# Patient Record
Sex: Male | Born: 1960 | Race: Black or African American | Marital: Single | Smoking: Current every day smoker
Health system: Southern US, Community
[De-identification: ages and names within clinical notes are randomized; demographics above are authoritative.]

## PROBLEM LIST (undated history)

## (undated) DIAGNOSIS — M1990 Unspecified osteoarthritis, unspecified site: Secondary | ICD-10-CM

## (undated) DIAGNOSIS — K219 Gastro-esophageal reflux disease without esophagitis: Secondary | ICD-10-CM

## (undated) DIAGNOSIS — T7840XA Allergy, unspecified, initial encounter: Secondary | ICD-10-CM

## (undated) DIAGNOSIS — M5442 Lumbago with sciatica, left side: Principal | ICD-10-CM

## (undated) DIAGNOSIS — G4730 Sleep apnea, unspecified: Secondary | ICD-10-CM

## (undated) DIAGNOSIS — F1910 Other psychoactive substance abuse, uncomplicated: Secondary | ICD-10-CM

## (undated) DIAGNOSIS — G8929 Other chronic pain: Principal | ICD-10-CM

## (undated) DIAGNOSIS — M5441 Lumbago with sciatica, right side: Principal | ICD-10-CM

## (undated) DIAGNOSIS — F329 Major depressive disorder, single episode, unspecified: Secondary | ICD-10-CM

## (undated) DIAGNOSIS — F419 Anxiety disorder, unspecified: Secondary | ICD-10-CM

## (undated) DIAGNOSIS — F32A Depression, unspecified: Secondary | ICD-10-CM

## (undated) HISTORY — DX: Lumbago with sciatica, left side: M54.42

## (undated) HISTORY — DX: Unspecified osteoarthritis, unspecified site: M19.90

## (undated) HISTORY — DX: Lumbago with sciatica, right side: M54.41

## (undated) HISTORY — DX: Other chronic pain: G89.29

## (undated) HISTORY — DX: Other psychoactive substance abuse, uncomplicated: F19.10

## (undated) HISTORY — DX: Major depressive disorder, single episode, unspecified: F32.9

## (undated) HISTORY — DX: Sleep apnea, unspecified: G47.30

## (undated) HISTORY — DX: Anxiety disorder, unspecified: F41.9

## (undated) HISTORY — DX: Gastro-esophageal reflux disease without esophagitis: K21.9

## (undated) HISTORY — DX: Allergy, unspecified, initial encounter: T78.40XA

## (undated) HISTORY — DX: Depression, unspecified: F32.A

---

## 2012-02-17 DIAGNOSIS — M659 Synovitis and tenosynovitis, unspecified: Secondary | ICD-10-CM | POA: Insufficient documentation

## 2012-09-26 HISTORY — PX: CARPAL TUNNEL RELEASE: SHX101

## 2015-01-20 DIAGNOSIS — M67432 Ganglion, left wrist: Secondary | ICD-10-CM | POA: Insufficient documentation

## 2015-01-20 DIAGNOSIS — M24541 Contracture, right hand: Secondary | ICD-10-CM | POA: Insufficient documentation

## 2015-04-22 DIAGNOSIS — Z9989 Dependence on other enabling machines and devices: Secondary | ICD-10-CM

## 2015-04-22 DIAGNOSIS — F319 Bipolar disorder, unspecified: Secondary | ICD-10-CM | POA: Insufficient documentation

## 2015-04-22 DIAGNOSIS — G4733 Obstructive sleep apnea (adult) (pediatric): Secondary | ICD-10-CM | POA: Insufficient documentation

## 2015-11-23 DIAGNOSIS — M25532 Pain in left wrist: Secondary | ICD-10-CM

## 2015-11-23 DIAGNOSIS — G8929 Other chronic pain: Secondary | ICD-10-CM | POA: Insufficient documentation

## 2016-11-24 DIAGNOSIS — M545 Low back pain: Secondary | ICD-10-CM

## 2016-11-24 DIAGNOSIS — G8929 Other chronic pain: Secondary | ICD-10-CM | POA: Insufficient documentation

## 2017-01-05 HISTORY — PX: BACK SURGERY: SHX140

## 2017-02-09 DIAGNOSIS — Z981 Arthrodesis status: Secondary | ICD-10-CM | POA: Insufficient documentation

## 2017-06-29 DIAGNOSIS — M5416 Radiculopathy, lumbar region: Secondary | ICD-10-CM | POA: Insufficient documentation

## 2018-09-12 ENCOUNTER — Ambulatory Visit: Payer: Non-veteran care | Admitting: Nurse Practitioner

## 2018-10-02 ENCOUNTER — Ambulatory Visit: Payer: Non-veteran care | Admitting: Nurse Practitioner

## 2018-10-03 ENCOUNTER — Ambulatory Visit
Admission: RE | Admit: 2018-10-03 | Discharge: 2018-10-03 | Disposition: A | Discharge status: Home / Self Care | Payer: No Typology Code available for payment source | Source: Ambulatory Visit | Attending: Nurse Practitioner | Admitting: Nurse Practitioner

## 2018-10-03 ENCOUNTER — Ambulatory Visit (HOSPITAL_BASED_OUTPATIENT_CLINIC_OR_DEPARTMENT_OTHER): Payer: No Typology Code available for payment source | Admitting: Nurse Practitioner

## 2018-10-03 ENCOUNTER — Encounter: Payer: Self-pay | Admitting: Nurse Practitioner

## 2018-10-03 VITALS — BP 106/59 | HR 73 | Temp 98.6°F | Resp 16 | Ht 68.0 in | Wt 180.0 lb

## 2018-10-03 DIAGNOSIS — Z79891 Long term (current) use of opiate analgesic: Secondary | ICD-10-CM

## 2018-10-03 DIAGNOSIS — M25511 Pain in right shoulder: Secondary | ICD-10-CM

## 2018-10-03 DIAGNOSIS — G8929 Other chronic pain: Secondary | ICD-10-CM | POA: Insufficient documentation

## 2018-10-03 DIAGNOSIS — G894 Chronic pain syndrome: Secondary | ICD-10-CM | POA: Insufficient documentation

## 2018-10-03 DIAGNOSIS — M533 Sacrococcygeal disorders, not elsewhere classified: Secondary | ICD-10-CM | POA: Insufficient documentation

## 2018-10-03 DIAGNOSIS — M5442 Lumbago with sciatica, left side: Secondary | ICD-10-CM | POA: Diagnosis not present

## 2018-10-03 DIAGNOSIS — Z789 Other specified health status: Secondary | ICD-10-CM | POA: Insufficient documentation

## 2018-10-03 DIAGNOSIS — M79605 Pain in left leg: Secondary | ICD-10-CM

## 2018-10-03 DIAGNOSIS — Z79899 Other long term (current) drug therapy: Secondary | ICD-10-CM

## 2018-10-03 DIAGNOSIS — M5441 Lumbago with sciatica, right side: Secondary | ICD-10-CM | POA: Insufficient documentation

## 2018-10-03 DIAGNOSIS — M79604 Pain in right leg: Secondary | ICD-10-CM

## 2018-10-03 DIAGNOSIS — M899 Disorder of bone, unspecified: Secondary | ICD-10-CM | POA: Insufficient documentation

## 2018-10-03 HISTORY — DX: Other chronic pain: G89.29

## 2018-10-03 NOTE — Progress Notes (Signed)
Safety precautions to be maintained throughout the outpatient stay will include: orient to surroundings, keep bed in low position, maintain call bell within reach at all times, provide assistance with transfer out of bed and ambulation.  

## 2018-10-03 NOTE — Patient Instructions (Addendum)
____________________________________________________________________________________________  Appointment Policy Summary  It is our goal and responsibility to provide the medical community with assistance in the evaluation and management of patients with chronic pain. Unfortunately our resources are limited. Because we do not have an unlimited amount of time, or available appointments, we are required to closely monitor and manage their use. The following rules exist to maximize their use:  Patient's responsibilities: 1. Punctuality:  At what time should I arrive? You should be physically present in our office 30 minutes before your scheduled appointment. Your scheduled appointment is with your assigned healthcare provider. However, it takes 5-10 minutes to be "checked-in", and another 15 minutes for the nurses to do the admission. If you arrive to our office at the time you were given for your appointment, you will end up being at least 20-25 minutes late to your appointment with the provider. 2. Tardiness:  What happens if I arrive only a few minutes after my scheduled appointment time? You will need to reschedule your appointment. The cutoff is your appointment time. This is why it is so important that you arrive at least 30 minutes before that appointment. If you have an appointment scheduled for 10:00 AM and you arrive at 10:01, you will be required to reschedule your appointment.  3. Plan ahead:  Always assume that you will encounter traffic on your way in. Plan for it. If you are dependent on a driver, make sure they understand these rules and the need to arrive early. 4. Other appointments and responsibilities:  Avoid scheduling any other appointments before or after your pain clinic appointments.  5. Be prepared:  Write down everything that you need to discuss with your healthcare provider and give this information to the admitting nurse. Write down the medications that you will need  refilled. Bring your pills and bottles (even the empty ones), to all of your appointments, except for those where a procedure is scheduled. 6. No children or pets:  Find someone to take care of them. It is not appropriate to bring them in. 7. Scheduling changes:  We request "advanced notification" of any changes or cancellations. 8. Advanced notification:  Defined as a time period of more than 24 hours prior to the originally scheduled appointment. This allows for the appointment to be offered to other patients. 9. Rescheduling:  When a visit is rescheduled, it will require the cancellation of the original appointment. For this reason they both fall within the category of "Cancellations".  10. Cancellations:  They require advanced notification. Any cancellation less than 24 hours before the  appointment will be recorded as a "No Show". 11. No Show:  Defined as an unkept appointment where the patient failed to notify or declare to the practice their intention or inability to keep the appointment.  Corrective process for repeat offenders:  1. Tardiness: Three (3) episodes of rescheduling due to late arrivals will be recorded as one (1) "No Show". 2. Cancellation or reschedule: Three (3) cancellations or rescheduling will be recorded as one (1) "No Show". 3. "No Shows": Three (3) "No Shows" within a 12 month period will result in discharge from the practice. ____________________________________________________________________________________________   ______________________________________________________________________________________________  Specialty Pain Scale  Introduction:  There are significant differences in how pain is reported. The word pain usually refers to physical pain, but it is also a common synonym of suffering. The medical community uses a scale from 0 (zero) to 10 (ten) to report pain level. Zero (0) is described as "no pain",   while ten (10) is described as "the worse pain  you can imagine". The problem with this scale is that physical pain is reported along with suffering. Suffering refers to mental pain, or more often yet it refers to any unpleasant feeling, emotion or aversion associated with the perception of harm or threat of harm. It is the psychological component of pain.  Pain Specialists prefer to separate the two components. The pain scale used by this practice is the Verbal Numerical Rating Scale (VNRS-11). This scale is for the physical pain only. DO NOT INCLUDE how your pain psychologically affects you. This scale is for adults 21 years of age and older. It has 11 (eleven) levels. The 1st level is 0/10. This means: "right now, I have no pain". In the context of pain management, it also means: "right now, my physical pain is under control with the current therapy".  General Information:  The scale should reflect your current level of pain. Unless you are specifically asked for the level of your worst pain, or your average pain. If you are asked for one of these two, then it should be understood that it is over the past 24 hours.  Levels 1 (one) through 5 (five) are described below, and can be treated as an outpatient. Ambulatory pain management facilities such as ours are more than adequate to treat these levels. Levels 6 (six) through 10 (ten) are also described below, however, these must be treated as a hospitalized patient. While levels 6 (six) and 7 (seven) may be evaluated at an urgent care facility, levels 8 (eight) through 10 (ten) constitute medical emergencies and as such, they belong in a hospital's emergency department. When having these levels (as described below), do not come to our office. Our facility is not equipped to manage these levels. Go directly to an urgent care facility or an emergency department to be evaluated.  Definitions:  Activities of Daily Living (ADL): Activities of daily living (ADL or ADLs) is a term used in healthcare to refer to  people's daily self-care activities. Health professionals often use a person's ability or inability to perform ADLs as a measurement of their functional status, particularly in regard to people post injury, with disabilities and the elderly. There are two ADL levels: Basic and Instrumental. Basic Activities of Daily Living (BADL  or BADLs) consist of self-care tasks that include: Bathing and showering; personal hygiene and grooming (including brushing/combing/styling hair); dressing; Toilet hygiene (getting to the toilet, cleaning oneself, and getting back up); eating and self-feeding (not including cooking or chewing and swallowing); functional mobility, often referred to as "transferring", as measured by the ability to walk, get in and out of bed, and get into and out of a chair; the broader definition (moving from one place to another while performing activities) is useful for people with different physical abilities who are still able to get around independently. Basic ADLs include the things many people do when they get up in the morning and get ready to go out of the house: get out of bed, go to the toilet, bathe, dress, groom, and eat. On the average, loss of function typically follows a particular order. Hygiene is the first to go, followed by loss of toilet use and locomotion. The last to go is the ability to eat. When there is only one remaining area in which the person is independent, there is a 62.9% chance that it is eating and only a 3.5% chance that it is hygiene. Instrumental Activities   of Daily Living (IADL or IADLs) are not necessary for fundamental functioning, but they let an individual live independently in a community. IADL consist of tasks that include: cleaning and maintaining the house; home establishment and maintenance; care of others (including selecting and supervising caregivers); care of pets; child rearing; managing money; managing financials (investments, etc.); meal preparation  and cleanup; shopping for groceries and necessities; moving within the community; safety procedures and emergency responses; health management and maintenance (taking prescribed medications); and using the telephone or other form of communication.  Instructions:  Most patients tend to report their pain as a combination of two factors, their physical pain and their psychosocial pain. This last one is also known as "suffering" and it is reflection of how physical pain affects you socially and psychologically. From now on, report them separately.  From this point on, when asked to report your pain level, report only your physical pain. Use the following table for reference.  Pain Clinic Pain Levels (0-5/10)  Pain Level Score  Description  No Pain 0   Mild pain 1 Nagging, annoying, but does not interfere with basic activities of daily living (ADL). Patients are able to eat, bathe, get dressed, toileting (being able to get on and off the toilet and perform personal hygiene functions), transfer (move in and out of bed or a chair without assistance), and maintain continence (able to control bladder and bowel functions). Blood pressure and heart rate are unaffected. A normal heart rate for a healthy adult ranges from 60 to 100 bpm (beats per minute).   Mild to moderate pain 2 Noticeable and distracting. Impossible to hide from other people. More frequent flare-ups. Still possible to adapt and function close to normal. It can be very annoying and may have occasional stronger flare-ups. With discipline, patients may get used to it and adapt.   Moderate pain 3 Interferes significantly with activities of daily living (ADL). It becomes difficult to feed, bathe, get dressed, get on and off the toilet or to perform personal hygiene functions. Difficult to get in and out of bed or a chair without assistance. Very distracting. With effort, it can be ignored when deeply involved in activities.   Moderately severe pain  4 Impossible to ignore for more than a few minutes. With effort, patients may still be able to manage work or participate in some social activities. Very difficult to concentrate. Signs of autonomic nervous system discharge are evident: dilated pupils (mydriasis); mild sweating (diaphoresis); sleep interference. Heart rate becomes elevated (>115 bpm). Diastolic blood pressure (lower number) rises above 100 mmHg. Patients find relief in laying down and not moving.   Severe pain 5 Intense and extremely unpleasant. Associated with frowning face and frequent crying. Pain overwhelms the senses.  Ability to do any activity or maintain social relationships becomes significantly limited. Conversation becomes difficult. Pacing back and forth is common, as getting into a comfortable position is nearly impossible. Pain wakes you up from deep sleep. Physical signs will be obvious: pupillary dilation; increased sweating; goosebumps; brisk reflexes; cold, clammy hands and feet; nausea, vomiting or dry heaves; loss of appetite; significant sleep disturbance with inability to fall asleep or to remain asleep. When persistent, significant weight loss is observed due to the complete loss of appetite and sleep deprivation.  Blood pressure and heart rate becomes significantly elevated. Caution: If elevated blood pressure triggers a pounding headache associated with blurred vision, then the patient should immediately seek attention at an urgent or emergency care unit, as   these may be signs of an impending stroke.    Emergency Department Pain Levels (6-10/10)  Emergency Room Pain 6 Severely limiting. Requires emergency care and should not be seen or managed at an outpatient pain management facility. Communication becomes difficult and requires great effort. Assistance to reach the emergency department may be required. Facial flushing and profuse sweating along with potentially dangerous increases in heart rate and blood pressure  will be evident.   Distressing pain 7 Self-care is very difficult. Assistance is required to transport, or use restroom. Assistance to reach the emergency department will be required. Tasks requiring coordination, such as bathing and getting dressed become very difficult.   Disabling pain 8 Self-care is no longer possible. At this level, pain is disabling. The individual is unable to do even the most "basic" activities such as walking, eating, bathing, dressing, transferring to a bed, or toileting. Fine motor skills are lost. It is difficult to think clearly.   Incapacitating pain 9 Pain becomes incapacitating. Thought processing is no longer possible. Difficult to remember your own name. Control of movement and coordination are lost.   The worst pain imaginable 10 At this level, most patients pass out from pain. When this level is reached, collapse of the autonomic nervous system occurs, leading to a sudden drop in blood pressure and heart rate. This in turn results in a temporary and dramatic drop in blood flow to the brain, leading to a loss of consciousness. Fainting is one of the body's self defense mechanisms. Passing out puts the brain in a calmed state and causes it to shut down for a while, in order to begin the healing process.    Summary: 1. Refer to this scale when providing [place] with your pain level. 2. Be accurate and careful when reporting your pain level. This will help with your care. 3. Over-reporting your pain level will lead to loss of credibility. 4. Even a level of 1/10 means that there is pain and will be treated at our facility. 5. High, inaccurate reporting will be documented as "Symptom Exaggeration", leading to loss of credibility and suspicions of possible secondary gains such as obtaining more narcotics, or wanting to appear disabled, for fraudulent reasons. 6. Only pain levels of 5 or below will be seen at our facility. 7. Pain levels of 6 and above will be sent to the  Emergency Department and the appointment cancelled. ______________________________________________________________________________________________  BMI Assessment: Estimated body mass index is 27.37 kg/m as calculated from the following:   Height as of this encounter: 5\' 8"  (1.727 m).   Weight as of this encounter: 180 lb (81.6 kg).  BMI interpretation table: BMI level Category Range association with higher incidence of chronic pain  <18 kg/m2 Underweight   18.5-24.9 kg/m2 Ideal body weight   25-29.9 kg/m2 Overweight Increased incidence by 20%  30-34.9 kg/m2 Obese (Class I) Increased incidence by 68%  35-39.9 kg/m2 Severe obesity (Class II) Increased incidence by 136%  >40 kg/m2 Extreme obesity (Class III) Increased incidence by 254%   BMI Readings from Last 4 Encounters:  10/03/18 27.37 kg/m   Wt Readings from Last 4 Encounters:  10/03/18 180 lb (81.6 kg)

## 2018-10-03 NOTE — Progress Notes (Signed)
Patient's Name: Nicolas Colon  MRN: [id number]  Referring Provider: [facility]  DOB: 1960/11/29  PCP: [facility]  DOS: 10/03/2018  Note by: Dionisio David NP  Service setting: Ambulatory outpatient  Specialty: Interventional Pain Management  Location: [facility] (AMB) Pain Management Facility    Patient type: New Patient    Primary Reason(s) for Visit: Initial Patient Evaluation CC: Back Pain (lumbar bilateral, right is worse ); Shoulder Pain (bilateral right is worse ); and Wrist Pain (CTS, release on the left, braces on both )  HPI  Mr. Kretschmer is a 58 y.o. year old, male patient, who comes today for an initial evaluation. He has Pharmacologic therapy; Disorder of skeletal system; Problems influencing health status; Long term current use of opiate analgesic; Chronic pain syndrome; Chronic bilateral low back pain with bilateral sciatica (Primary Area of Pain) (R>L); Chronic pain of both lower extremities (Secondary Area of Pain) (R>L); and Chronic right shoulder pain (Tertiary Area of Pain) on their problem list.. His primarily concern today is the Back Pain (lumbar bilateral, right is worse ); Shoulder Pain (bilateral right is worse ); and Wrist Pain (CTS, release on the left, braces on both )  Pain Assessment: Location: Lower, Right, Left Back Radiating: back pain goes down both legs to the toes.  shoulder pain going into right arm.  Onset: More than a month ago Duration: Chronic pain Quality: Pins and needles, Nagging, Burning, Constant, Aching, Pressure, Sharp, Shooting, Stabbing, Throbbing, Tingling, Other (Comment)(aggonizing and pulsating. ) Severity: 5 /10 (subjective, self-reported pain score)  Note: Reported level is compatible with observation.                          Effect on ADL: patient has trouble bending, standing, walking, kneeling, working.  Timing: Constant Modifying factors: medications, stretching, rest  BP: (!) 106/59  HR: 73  Onset and Duration: Sudden, Date of  onset: 58 and Present longer than 3 months Cause of pain: Work related accident or event Severity: Getting worse, NAS-11 at its worse: 8/10, NAS-11 at its best: 3/10, NAS-11 now: 5/10 and NAS-11 on the average: 4/10 Timing: Not influenced by the time of the day, During activity or exercise, After activity or exercise and After a period of immobility Aggravating Factors: Bending, Climbing, Intercourse (sex), Kneeling, Lifiting, Prolonged sitting, Prolonged standing, Squatting, Stooping , Twisting, Walking, Walking uphill and Working Alleviating Factors: Stretching, Lying down, Medications, Resting and Sitting Associated Problems: Day-time cramps, Night-time cramps, Depression, Numbness, Spasms, Temperature changes, Tingling, Weakness, Pain that wakes patient up and Pain that does not allow patient to sleep Quality of Pain: Aching, Agonizing, Annoying, Burning, Constant, Nagging, Numb, Pressure-like, Pulsating, Sharp, Shooting, Stabbing, Throbbing, Tingling and Uncomfortable Previous Examinations or Tests: MRI scan, X-rays, Nerve conduction test and Neurosurgical evaluation Previous Treatments: Chiropractic manipulations, Epidural steroid injections, Narcotic medications, Physical Therapy, Stretching exercises and TENS  The patient comes into the clinics today for the first time for a chronic pain management evaluation.  According to the patient his primary area of pain is in his lower back.  He admits that he has history of a slipped disc.  He did undergo surgery is January 05, 2017 which included a lumbar fusion.  He admits the surgery was effective for his left leg pain.  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  He has had physical therapy along with chiropractic treatment.  He  admits that they both were effective for approximately 2 to 3 months.  His second area of pain is in his legs.  He admits that the right is currently  worse than the left.  He does have occasional left-sided leg pain and when that occurs it is worse than the right.  He admits the pain goes down the sides of his leg rotates to the front then to the bottom of both feet.  He has numbness tingling with occasional weakness.  He has had a nerve conduction study through the [facility].  His third area of pain is in his right shoulder.  He feels like it may be a pulled muscle however it has continued.  He denies any previous surgery, interventional therapy, physical therapy or recent images of his shoulder.  Today I took the time to provide the patient with information regarding this pain practice. The patient was informed that the practice is divided into two sections: an interventional pain management section, as well as a completely separate and distinct medication management section. I explained that there are procedure days for interventional therapies, and evaluation days for follow-ups and medication management. Because of the amount of documentation required during both, they are kept separated. This means that there is the possibility that he may be scheduled for a procedure on one day, and medication management the next. I have also informed him that because of staffing and facility limitations, this practice will no longer take patients for medication management only. To illustrate the reasons for this, I gave the patient the example of surgeons, and how inappropriate it would be to refer a patient to his/her care, just to write for the post-surgical antibiotics on a surgery done by a different surgeon.   Because interventional pain management is part of the board-certified specialty for the doctors, the patient was informed that joining this practice means that they are open to any and all interventional therapies. I made it clear that this does not mean that they will be forced to have any procedures done. What this means is that I believe  interventional therapies to be essential part of the diagnosis and proper management of chronic pain conditions. Therefore, patients not interested in these interventional alternatives will be better served under the care of a different practitioner.  The patient was also made aware of my Comprehensive Pain Management Safety Guidelines where by joining this practice, they limit all of their nerve blocks and joint injections to those done by our practice, for as long as we are retained to manage their care. Historic Controlled Substance Pharmacotherapy Review  PMP and historical list of controlled substances: Oxycodone 10 mg, hydrocodone/acetaminophen 5/325 mg, diazepam 5 mg, oxycodone/acetaminophen 5/325 mg, tramadol 50 mg, Highest opioid analgesic regimen found: Hydrocodone/acetaminophen 5/325 mg 1 to 2 tablets 2 to 3 hours for 6 days (fill date 01/06/2017) hydrocodone 62.5 mg/day Most recent opioid analgesic: None Current opioid analgesics: None Highest recorded MME/day: 62.5 mg/day MME/day: 0 mg/day Medications: The patient did not bring the medication(s) to the appointment, as requested in our "New Patient Package" Pharmacodynamics: Desired effects: Analgesia: The patient reports >50% benefit. Reported improvement in function: The patient reports medication allows him to accomplish basic ADLs. Clinically meaningful improvement in function (CMIF): Sustained CMIF goals met Perceived effectiveness: Described as relatively effective, allowing for increase in activities of daily living (ADL) Undesirable effects: Side-effects or Adverse reactions: None reported Historical Monitoring: The patient  reports current drug use. Drug: Marijuana. List of all  UDS Test(s): No results found for: MDMA, COCAINSCRNUR, PCPSCRNUR, PCPQUANT, CANNABQUANT, THCU, ETH List of all Serum Drug Screening Test(s):  No results found for: AMPHSCRSER, BARBSCRSER, BENZOSCRSER, COCAINSCRSER, PCPSCRSER, PCPQUANT, THCSCRSER,  CANNABQUANT, OPIATESCRSER, OXYSCRSER, PROPOXSCRSER Historical Background Evaluation: [place] PDMP: Six (6) year initial data search conducted.             [place] Department of public safety, offender search: Editor, commissioning Information) Non-contributory Risk Assessment Profile: Aberrant behavior: None observed or detected today Risk factors for fatal opioid overdose: None identified today Fatal overdose hazard ratio (HR): Calculation deferred Non-fatal overdose hazard ratio (HR): Calculation deferred Risk of opioid abuse or dependence: 0.7-3.0% with doses ? 36 MME/day and 6.1-26% with doses ? 120 MME/day. Substance use disorder (SUD) risk level: Pending results of Medical Psychology Evaluation for SUD Opioid risk tool (ORT) (Total Score): 13  ORT Scoring interpretation table:  Score <3 = Low Risk for SUD  Score between 4-7 = Moderate Risk for SUD  Score >8 = High Risk for Opioid Abuse   PHQ-2 Depression Scale:  Total score:    PHQ-2 Scoring interpretation table: (Score and probability of major depressive disorder)  Score 0 = No depression  Score 1 = 15.4% Probability  Score 2 = 21.1% Probability  Score 3 = 38.4% Probability  Score 4 = 45.5% Probability  Score 5 = 56.4% Probability  Score 6 = 78.6% Probability   PHQ-9 Depression Scale:  Total score:    PHQ-9 Scoring interpretation table:  Score 0-4 = No depression  Score 5-9 = Mild depression  Score 10-14 = Moderate depression  Score 15-19 = Moderately severe depression  Score 20-27 = Severe depression (2.4 times higher risk of SUD and 2.89 times higher risk of overuse)   Pharmacologic Plan: Pending ordered tests and/or consults  Meds  The patient has a current medication list which includes the following prescription(s): aspirin, augmented betamethasone dipropionate, baclofen, budesonide-formoterol, bupropion, carboxymethylcellulose, divalproex, duloxetine, flunisolide, gabapentin, hydroxyzine, ipratropium, ipratropium-albuterol, ketotifen,  lisinopril, meloxicam, montelukast, multiple vitamins-minerals, omeprazole, prazosin, quetiapine, ranitidine, sildenafil, sodium chloride, terazosin, trazodone, and vitamin d (cholecalciferol).  Current Outpatient Medications on File Prior to Visit  Medication Sig  . aspirin 81 MG chewable tablet Chew 81 mg by mouth daily.  Marland Kitchen augmented betamethasone dipropionate (DIPROLENE-AF) 0.05 % ointment Apply topically 2 (two) times daily.  . baclofen (LIORESAL) 10 MG tablet Take 10 mg by mouth 3 (three) times daily.  . budesonide-formoterol (SYMBICORT) 160-4.5 MCG/ACT inhaler Inhale 2 puffs into the lungs 2 (two) times daily.  Marland Kitchen buPROPion (ZYBAN) 150 MG 12 hr tablet Take 150 mg by mouth daily.  . carboxymethylcellulose (REFRESH PLUS) 0.5 % SOLN Place 1 drop into both eyes 3 (three) times daily as needed.  . divalproex (DEPAKOTE) 500 MG DR tablet Take 500 mg by mouth at bedtime.  . DULoxetine (CYMBALTA) 60 MG capsule Take 60 mg by mouth daily.  Marland Kitchen FLUNISOLIDE, NASAL, NA Place 1 spray into the nose 2 (two) times daily.  Marland Kitchen gabapentin (NEURONTIN) 300 MG capsule Take 300 mg by mouth 3 (three) times daily.  . hydrOXYzine (VISTARIL) 25 MG capsule Take 25 mg by mouth 3 (three) times daily as needed.  Marland Kitchen ipratropium (ATROVENT) 0.03 % nasal spray Place 2 sprays into both nostrils every 12 (twelve) hours.  . Ipratropium-Albuterol (COMBIVENT) 20-100 MCG/ACT AERS respimat Inhale 1 puff into the lungs every 6 (six) hours.  Marland Kitchen ketotifen (ZADITOR) 0.025 % ophthalmic solution Place 1 drop into both eyes 2 (two) times daily.  Marland Kitchen lisinopril (PRINIVIL,ZESTRIL)  10 MG tablet Take 10 mg by mouth daily.  . meloxicam (MOBIC) 15 MG tablet Take 15 mg by mouth daily.  . montelukast (SINGULAIR) 10 MG tablet Take 10 mg by mouth at bedtime.  . Multiple Vitamins-Minerals (MULTIVITAMINS/MINERALS ADULT PO) Take 1 tablet by mouth daily.  Marland Kitchen omeprazole (PRILOSEC) 20 MG capsule Take 20 mg by mouth 2 (two) times daily before a meal.  . prazosin  (MINIPRESS) 1 MG capsule Take 1 mg by mouth at bedtime.  Marland Kitchen QUEtiapine (SEROQUEL) 100 MG tablet Take 100 mg by mouth at bedtime.  . ranitidine (ZANTAC) 150 MG capsule Take 150 mg by mouth every evening.  . sildenafil (VIAGRA) 100 MG tablet Take 100 mg by mouth daily as needed for erectile dysfunction.  . sodium chloride 0.9 % nebulizer solution Take 3 mLs by nebulization every 6 (six) hours as needed for wheezing.  . terazosin (HYTRIN) 2 MG capsule Take 2 mg by mouth at bedtime.  . traZODone (DESYREL) 100 MG tablet Take 100 mg by mouth at bedtime.  . Vitamin D, Cholecalciferol, 50 MCG (2000 UT) CAPS Take 4,000 Units by mouth daily.   No current facility-administered medications on file prior to visit.    Imaging Review  Note: Available results from prior imaging studies were reviewed.        ROS  Cardiovascular History: Daily Aspirin intake Pulmonary or Respiratory History: Smoking and Temporary stoppage of breathing during sleep Neurological History: No reported neurological signs or symptoms such as seizures, abnormal skin sensations, urinary and/or fecal incontinence, being born with an abnormal open spine and/or a tethered spinal cord Review of Past Neurological Studies: No results found for this or any previous visit. Psychological-Psychiatric History: Psychiatric disorder, Anxiousness and Depressed Gastrointestinal History: Reflux or heatburn Genitourinary History: No reported renal or genitourinary signs or symptoms such as difficulty voiding or producing urine, peeing blood, non-functioning kidney, kidney stones, difficulty emptying the bladder, difficulty controlling the flow of urine, or chronic kidney disease Hematological History: No reported hematological signs or symptoms such as prolonged bleeding, low or poor functioning platelets, bruising or bleeding easily, hereditary bleeding problems, low energy levels due to low hemoglobin or being anemic Endocrine History: 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 Markes is allergic to aspirin; grass extracts [gramineae pollens]; hydrocodone; keflex [cephalexin]; and niacin and related.  Laboratory Chemistry  Inflammation Markers Lab Results  Component Value Date   CRP 3 10/03/2018   ESRSEDRATE 6 10/03/2018   (CRP: Acute Phase) (ESR: Chronic Phase) Renal Function Markers Lab Results  Component Value Date   BUN 15 10/03/2018   CREATININE 1.00 10/03/2018   GFRAA 95 10/03/2018   GFRNONAA 83 10/03/2018   Hepatic Function Markers Lab Results  Component Value Date   AST 17 10/03/2018   ALBUMIN 3.9 10/03/2018   ALKPHOS 47 10/03/2018   Electrolytes Lab Results  Component Value Date   NA 141 10/03/2018   K 5.0 10/03/2018   CL 105 10/03/2018   CALCIUM 9.2 10/03/2018   MG 2.2 10/03/2018   Neuropathy Markers Lab Results  Component Value Date   MWNUUVOZ36 644 10/03/2018   Bone Pathology Markers Lab Results  Component Value Date   ALKPHOS 47 10/03/2018   25OHVITD1 WILL FOLLOW 10/03/2018   25OHVITD2 WILL FOLLOW 10/03/2018   25OHVITD3 WILL  FOLLOW 10/03/2018   CALCIUM 9.2 10/03/2018   Coagulation Parameters No results found for: INR, LABPROT, APTT, PLT Cardiovascular Markers No results found for: BNP, HGB, HCT Note: Lab results reviewed.  [place]  Drug: Mr. Dugue  reports current drug use. Drug: Marijuana. Alcohol:  reports previous alcohol use. Tobacco:  reports that he has been smoking. He started smoking about 13 years ago. He has been smoking about 0.50 packs per day. He has never used smokeless tobacco. Medical:  has a past medical history of Allergy, Anxiety, Arthritis, Chronic bilateral low back pain with bilateral sciatica  (Primary Area of Pain) (R>L) (10/03/2018), Depression, GERD (gastroesophageal reflux disease), Sleep apnea, and Substance abuse ([place]). Family: family history includes Kidney disease in his brother.  Past Surgical History:  Procedure Laterality Date  . BACK SURGERY  01/05/2017  . CARPAL TUNNEL RELEASE Left 2014   surgery performed twice    Active Ambulatory Problems    Diagnosis Date Noted  . Pharmacologic therapy 10/03/2018  . Disorder of skeletal system 10/03/2018  . Problems influencing health status 10/03/2018  . Long term current use of opiate analgesic 10/03/2018  . Chronic pain syndrome 10/03/2018  . Chronic bilateral low back pain with bilateral sciatica (Primary Area of Pain) (R>L) 10/03/2018  . Chronic pain of both lower extremities (Secondary Area of Pain) (R>L) 10/03/2018  . Chronic right shoulder pain [facility] Area of Pain) 10/03/2018   Resolved Ambulatory Problems    Diagnosis Date Noted  . No Resolved Ambulatory Problems   Past Medical History:  Diagnosis Date  . Allergy   . Anxiety   . Arthritis   . Depression   . GERD (gastroesophageal reflux disease)   . Sleep apnea   . Substance abuse ([place])    Constitutional Exam  General appearance: Well nourished, well developed, and well hydrated. In no apparent acute distress Vitals:   10/03/18 1333  BP: (!) 106/59  Pulse: 73  Resp: 16  Temp: 98.6 F (37 C)  TempSrc: Oral  SpO2: 97%  Weight: 180 lb (81.6 kg)  Height: '5\' 8"'  (1.727 m)   BMI Assessment: Estimated body mass index is 27.37 kg/m as calculated from the following:   Height as of this encounter: '5\' 8"'  (1.727 m).   Weight as of this encounter: 180 lb (81.6 kg).  BMI interpretation table: BMI level Category Range association with higher incidence of chronic pain  <18 kg/m2 Underweight   18.5-24.9 kg/m2 Ideal body weight   25-29.9 kg/m2 Overweight Increased incidence by 20%  30-34.9 kg/m2 Obese (Class I) Increased incidence by 68%  35-39.9 kg/m2  Severe obesity (Class II) Increased incidence by 136%  >40 kg/m2 Extreme obesity (Class III) Increased incidence by 254%   BMI Readings from Last 4 Encounters:  10/03/18 27.37 kg/m   Wt Readings from Last 4 Encounters:  10/03/18 180 lb (81.6 kg)  Psych/Mental status: Alert, oriented x 3 (person, place, & time)       Eyes: PERLA Respiratory: No evidence of acute respiratory distress  Cervical Spine Exam  Inspection: No masses, redness, or swelling Alignment: Symmetrical Functional ROM: Unrestricted ROM      Stability: No instability detected Muscle strength & Tone: Guarding observed 4 out of 5 Sensory: Unimpaired  Palpation: Complains of area being tender to palpation               Upper Extremity (UE) Exam    Side: Right upper extremity  Side: Left upper extremity  Inspection: No masses, redness, swelling, or  asymmetry. No contractures  Inspection: No masses, redness, swelling, or asymmetry. No contractures  Functional ROM: Guarding          Functional ROM: Unrestricted ROM          Muscle strength & Tone: Functionally intact  Muscle strength & Tone: Functionally intact  Sensory: Unimpaired  Sensory: Unimpaired  Palpation: No palpable anomalies              Palpation: No palpable anomalies              Specialized Test(s): Deferred         Specialized Test(s): Deferred          Thoracic Spine Exam  Inspection: No masses, redness, or swelling Alignment: Symmetrical Functional ROM: Unrestricted ROM Stability: No instability detected Sensory: Unimpaired Muscle strength & Tone: No palpable anomalies  Lumbar Spine Exam  Inspection: Well healed scar from previous spine surgery detected Alignment: Symmetrical Functional ROM: Unrestricted ROM      Stability: No instability detected Muscle strength & Tone: Increased muscle tone over affected area Sensory: Movement-associated pain Palpation: Complains of area being tender to palpation       Provocative Tests: Lumbar  Hyperextension and rotation test: Positive bilaterally for facet joint pain. Patrick's Maneuver: Positive for right-sided S-I arthralgia              Gait & Posture Assessment  Ambulation: Unassisted Gait: Relatively normal for age and body habitus Posture: WNL   Lower Extremity Exam    Side: Right lower extremity  Side: Left lower extremity  Inspection: No masses, redness, swelling, or asymmetry. No contractures  Inspection: No masses, redness, swelling, or asymmetry. No contractures  Functional ROM: Unrestricted ROM          Functional ROM: Unrestricted ROM          Muscle strength & Tone: Functionally intact  Muscle strength & Tone: Functionally intact  Sensory: Unimpaired  Sensory: Unimpaired  Palpation: No palpable anomalies  Palpation: No palpable anomalies   Assessment  Primary Diagnosis & Pertinent Problem List: The primary encounter diagnosis was Chronic bilateral low back pain with bilateral sciatica (Primary Area of Pain) (R>L). Diagnoses of Chronic pain of both lower extremities (Secondary Area of Pain) (R>L), Chronic right shoulder pain (Tertiary Area of Pain), Pharmacologic therapy, Problems influencing health status, Long term current use of opiate analgesic, Chronic pain syndrome, and Chronic sacroiliac joint pain were also pertinent to this visit.  Visit Diagnosis: 1. Chronic bilateral low back pain with bilateral sciatica (Primary Area of Pain) (R>L)   2. Chronic pain of both lower extremities (Secondary Area of Pain) (R>L)   3. Chronic right shoulder pain (Tertiary Area of Pain)   4. Pharmacologic therapy   5. Problems influencing health status   6. Long term current use of opiate analgesic   7. Chronic pain syndrome   8. Chronic sacroiliac joint pain    Plan of Care  Initial treatment plan:  Please be advised that as per protocol, today's visit has been an evaluation only. We have not taken over the patient's controlled substance management.  Problem-specific  plan: No problem-specific Assessment & Plan notes found for this encounter.  Ordered Lab-work, Procedure(s), Referral(s), & Consult(s): Orders Placed This Encounter  Procedures  . DG Si Joints  . DG Shoulder Right  . Compliance Drug Analysis, Ur  . Comp. Metabolic Panel (12)  . Magnesium  . Vitamin B12  . Sedimentation rate  . 25-Hydroxyvitamin D Lcms D2+D3  .  C-reactive protein   Pharmacotherapy: Medications ordered:  No orders of the defined types were placed in this encounter.  Medications administered during this visit: Treylen Mentzel had no medications administered during this visit.   Pharmacotherapy under consideration:  Opioid Analgesics: The patient was informed that there is no guarantee that he would be a candidate for opioid analgesics. The decision will be made following CDC guidelines. This decision will be based on the results of diagnostic studies, as well as Mr. Tabron's risk profile.  Membrane stabilizer: To be determined at a later time Muscle relaxant: To be determined at a later time NSAID: To be determined at a later time Other analgesic(s): To be determined at a later time   Interventional therapies under consideration: Mr. Schueler was informed that there is no guarantee that he would be a candidate for interventional therapies. The decision will be based on the results of diagnostic studies, as well as Mr. Lagman's risk profile.  Possible procedure(s): Diagnostic midline epidural steroid injection Diagnostic bilateral lumbar facet nerve block Possible bilateral lumbar facet radiofrequency ablation Diagnostic right sacroiliac joint injection Possible right sacroiliac joint radiofrequency ablation   Provider-requested follow-up: Return for 2nd Visit, w/ Dr. Dossie Arbour.  Future Appointments  Date Time Provider [place]  10/17/2018 10:30 AM Milinda Pointer, MD [facility] None    Primary Care Physician: [place] Location: [facility] Outpatient  Pain Management Facility Note by:  Date: 10/03/2018; Time: 8:25 AM  Pain Score Disclaimer: We use the NRS-11 scale. This is a self-reported, subjective measurement of pain severity with only modest accuracy. It is used primarily to identify changes within a particular patient. It must be understood that outpatient pain scales are significantly less accurate that those used for research, where they can be applied under ideal controlled circumstances with minimal exposure to variables. In reality, the score is likely to be a combination of pain intensity and pain affect, where pain affect describes the degree of emotional arousal or changes in action readiness caused by the sensory experience of pain. Factors such as social and work situation, setting, emotional state, anxiety levels, expectation, and prior pain experience may influence pain perception and show large inter-individual differences that may also be affected by time variables.  Patient instructions provided during this appointment: Patient Instructions   ____________________________________________________________________________________________  Appointment Policy Summary  It is our goal and responsibility to provide the medical community with assistance in the evaluation and management of patients with chronic pain. Unfortunately our resources are limited. Because we do not have an unlimited amount of time, or available appointments, we are required to closely monitor and manage their use. The following rules exist to maximize their use:  Patient's responsibilities: 1. Punctuality:  At what time should I arrive? You should be physically present in our office 30 minutes before your scheduled appointment. Your scheduled appointment is with your assigned healthcare provider. However, it takes 5-10 minutes to be "checked-in", and another 15 minutes for the nurses to do the admission. If you arrive to our office at the time you were given for  your appointment, you will end up being at least 20-25 minutes late to your appointment with the provider. 2. Tardiness:  What happens if I arrive only a few minutes after my scheduled appointment time? You will need to reschedule your appointment. The cutoff is your appointment time. This is why it is so important that you arrive at least 30 minutes before that appointment. If you have an appointment scheduled for 10:00  AM and you arrive at 10:01, you will be required to reschedule your appointment.  3. Plan ahead:  Always assume that you will encounter traffic on your way in. Plan for it. If you are dependent on a driver, make sure they understand these rules and the need to arrive early. 4. Other appointments and responsibilities:  Avoid scheduling any other appointments before or after your pain clinic appointments.  5. Be prepared:  Write down everything that you need to discuss with your healthcare provider and give this information to the admitting nurse. Write down the medications that you will need refilled. Bring your pills and bottles (even the empty ones), to all of your appointments, except for those where a procedure is scheduled. 6. No children or pets:  Find someone to take care of them. It is not appropriate to bring them in. 7. Scheduling changes:  We request "advanced notification" of any changes or cancellations. 8. Advanced notification:  Defined as a time period of more than 24 hours prior to the originally scheduled appointment. This allows for the appointment to be offered to other patients. 9. Rescheduling:  When a visit is rescheduled, it will require the cancellation of the original appointment. For this reason they both fall within the category of "Cancellations".  10. Cancellations:  They require advanced notification. Any cancellation less than 24 hours before the  appointment will be recorded as a "No Show". 11. No Show:  Defined as an unkept appointment where the  patient failed to notify or declare to the practice their intention or inability to keep the appointment.  Corrective process for repeat offenders:  1. Tardiness: Three (3) episodes of rescheduling due to late arrivals will be recorded as one (1) "No Show". 2. Cancellation or reschedule: Three (3) cancellations or rescheduling will be recorded as one (1) "No Show". 3. "No Shows": Three (3) "No Shows" within a 12 month period will result in discharge from the practice. ____________________________________________________________________________________________   ______________________________________________________________________________________________  Specialty Pain Scale  Introduction:  There are significant differences in how pain is reported. The word pain usually refers to physical pain, but it is also a common synonym of suffering. The medical community uses a scale from 0 (zero) to 10 (ten) to report pain level. Zero (0) is described as "no pain", while ten (10) is described as "the worse pain you can imagine". The problem with this scale is that physical pain is reported along with suffering. Suffering refers to mental pain, or more often yet it refers to any unpleasant feeling, emotion or aversion associated with the perception of harm or threat of harm. It is the psychological component of pain.  Pain Specialists prefer to separate the two components. The pain scale used by this practice is the Verbal Numerical Rating Scale (VNRS-11). This scale is for the physical pain only. DO NOT INCLUDE how your pain psychologically affects you. This scale is for adults 26 years of age and older. It has 11 (eleven) levels. The 1st level is 0/10. This means: "right now, I have no pain". In the context of pain management, it also means: "right now, my physical pain is under control with the current therapy".  General Information:  The scale should reflect your current level of pain. Unless you are  specifically asked for the level of your worst pain, or your average pain. If you are asked for one of these two, then it should be understood that it is over the past 24 hours.  Levels  1 (one) through 5 (five) are described below, and can be treated as an outpatient. Ambulatory pain management facilities such as ours are more than adequate to treat these levels. Levels 6 (six) through 10 (ten) are also described below, however, these must be treated as a hospitalized patient. While levels 6 (six) and 7 (seven) may be evaluated at an urgent care facility, levels 8 (eight) through 10 (ten) constitute medical emergencies and as such, they belong in a hospital's emergency department. When having these levels (as described below), do not come to our office. Our facility is not equipped to manage these levels. Go directly to an urgent care facility or an emergency department to be evaluated.  Definitions:  Activities of Daily Living (ADL): Activities of daily living (ADL or ADLs) is a term used in healthcare to refer to people's daily self-care activities. Health professionals often use a person's ability or inability to perform ADLs as a measurement of their functional status, particularly in regard to people post injury, with disabilities and the elderly. There are two ADL levels: Basic and Instrumental. Basic Activities of Daily Living (BADL  or BADLs) consist of self-care tasks that include: Bathing and showering; personal hygiene and grooming (including brushing/combing/styling hair); dressing; Toilet hygiene (getting to the toilet, cleaning oneself, and getting back up); eating and self-feeding (not including cooking or chewing and swallowing); functional mobility, often referred to as "transferring", as measured by the ability to walk, get in and out of bed, and get into and out of a chair; the broader definition (moving from one place to another while performing activities) is useful for people with  different physical abilities who are still able to get around independently. Basic ADLs include the things many people do when they get up in the morning and get ready to go out of the house: get out of bed, go to the toilet, bathe, dress, groom, and eat. On the average, loss of function typically follows a particular order. Hygiene is the first to go, followed by loss of toilet use and locomotion. The last to go is the ability to eat. When there is only one remaining area in which the person is independent, there is a 62.9% chance that it is eating and only a 3.5% chance that it is hygiene. Instrumental Activities of Daily Living (IADL or IADLs) are not necessary for fundamental functioning, but they let an individual live independently in a community. IADL consist of tasks that include: cleaning and maintaining the house; home establishment and maintenance; care of others (including selecting and supervising caregivers); care of pets; child rearing; managing money; managing financials (investments, etc.); meal preparation and cleanup; shopping for groceries and necessities; moving within the community; safety procedures and emergency responses; health management and maintenance (taking prescribed medications); and using the telephone or other form of communication.  Instructions:  Most patients tend to report their pain as a combination of two factors, their physical pain and their psychosocial pain. This last one is also known as "suffering" and it is reflection of how physical pain affects you socially and psychologically. From now on, report them separately.  From this point on, when asked to report your pain level, report only your physical pain. Use the following table for reference.  Pain Clinic Pain Levels (0-5/10)  Pain Level Score  Description  No Pain 0   Mild pain 1 Nagging, annoying, but does not interfere with basic activities of daily living (ADL). Patients are able to eat, bathe, get  dressed, toileting (being able to get on and off the toilet and perform personal hygiene functions), transfer (move in and out of bed or a chair without assistance), and maintain continence (able to control bladder and bowel functions). Blood pressure and heart rate are unaffected. A normal heart rate for a healthy adult ranges from 60 to 100 bpm (beats per minute).   Mild to moderate pain 2 Noticeable and distracting. Impossible to hide from other people. More frequent flare-ups. Still possible to adapt and function close to normal. It can be very annoying and may have occasional stronger flare-ups. With discipline, patients may get used to it and adapt.   Moderate pain 3 Interferes significantly with activities of daily living (ADL). It becomes difficult to feed, bathe, get dressed, get on and off the toilet or to perform personal hygiene functions. Difficult to get in and out of bed or a chair without assistance. Very distracting. With effort, it can be ignored when deeply involved in activities.   Moderately severe pain 4 Impossible to ignore for more than a few minutes. With effort, patients may still be able to manage work or participate in some social activities. Very difficult to concentrate. Signs of autonomic nervous system discharge are evident: dilated pupils (mydriasis); mild sweating (diaphoresis); sleep interference. Heart rate becomes elevated (>115 bpm). Diastolic blood pressure (lower number) rises above 100 mmHg. Patients find relief in laying down and not moving.   Severe pain 5 Intense and extremely unpleasant. Associated with frowning face and frequent crying. Pain overwhelms the senses.  Ability to do any activity or maintain social relationships becomes significantly limited. Conversation becomes difficult. Pacing back and forth is common, as getting into a comfortable position is nearly impossible. Pain wakes you up from deep sleep. Physical signs will be obvious: pupillary  dilation; increased sweating; goosebumps; brisk reflexes; cold, clammy hands and feet; nausea, vomiting or dry heaves; loss of appetite; significant sleep disturbance with inability to fall asleep or to remain asleep. When persistent, significant weight loss is observed due to the complete loss of appetite and sleep deprivation.  Blood pressure and heart rate becomes significantly elevated. Caution: If elevated blood pressure triggers a pounding headache associated with blurred vision, then the patient should immediately seek attention at an urgent or emergency care unit, as these may be signs of an impending stroke.    Emergency Department Pain Levels (6-10/10)  Emergency Room Pain 6 Severely limiting. Requires emergency care and should not be seen or managed at an outpatient pain management facility. Communication becomes difficult and requires great effort. Assistance to reach the emergency department may be required. Facial flushing and profuse sweating along with potentially dangerous increases in heart rate and blood pressure will be evident.   Distressing pain 7 Self-care is very difficult. Assistance is required to transport, or use restroom. Assistance to reach the emergency department will be required. Tasks requiring coordination, such as bathing and getting dressed become very difficult.   Disabling pain 8 Self-care is no longer possible. At this level, pain is disabling. The individual is unable to do even the most "basic" activities such as walking, eating, bathing, dressing, transferring to a bed, or toileting. Fine motor skills are lost. It is difficult to think clearly.   Incapacitating pain 9 Pain becomes incapacitating. Thought processing is no longer possible. Difficult to remember your own name. Control of movement and coordination are lost.   The worst pain imaginable 10 At this level, most patients pass out from pain.  When this level is reached, collapse of the autonomic nervous  system occurs, leading to a sudden drop in blood pressure and heart rate. This in turn results in a temporary and dramatic drop in blood flow to the brain, leading to a loss of consciousness. Fainting is one of the body's self defense mechanisms. Passing out puts the brain in a calmed state and causes it to shut down for a while, in order to begin the healing process.    Summary: 1. Refer to this scale when providing [place] with your pain level. 2. Be accurate and careful when reporting your pain level. This will help with your care. 3. Over-reporting your pain level will lead to loss of credibility. 4. Even a level of 1/10 means that there is pain and will be treated at our facility. 5. High, inaccurate reporting will be documented as "Symptom Exaggeration", leading to loss of credibility and suspicions of possible secondary gains such as obtaining more narcotics, or wanting to appear disabled, for fraudulent reasons. 6. Only pain levels of 5 or below will be seen at our facility. 7. Pain levels of 6 and above will be sent to the Emergency Department and the appointment cancelled. ______________________________________________________________________________________________  BMI Assessment: Estimated body mass index is 27.37 kg/m as calculated from the following:   Height as of this encounter: '5\' 8"'  (1.727 m).   Weight as of this encounter: 180 lb (81.6 kg).  BMI interpretation table: BMI level Category Range association with higher incidence of chronic pain  <18 kg/m2 Underweight   18.5-24.9 kg/m2 Ideal body weight   25-29.9 kg/m2 Overweight Increased incidence by 20%  30-34.9 kg/m2 Obese (Class I) Increased incidence by 68%  35-39.9 kg/m2 Severe obesity (Class II) Increased incidence by 136%  >40 kg/m2 Extreme obesity (Class III) Increased incidence by 254%   BMI Readings from Last 4 Encounters:  10/03/18 27.37 kg/m   Wt Readings from Last 4 Encounters:  10/03/18 180 lb (81.6 kg)

## 2018-10-04 NOTE — Progress Notes (Signed)
Results were reviewed and found to be: mildly abnormal  No acute injury or pathology identified  Review would suggest interventional pain management techniques may be of benefit 

## 2018-10-08 LAB — C-REACTIVE PROTEIN: CRP: 3 mg/L (ref 0–10)

## 2018-10-08 LAB — COMP. METABOLIC PANEL (12)
AST: 17 IU/L (ref 0–40)
Albumin/Globulin Ratio: 2.1 (ref 1.2–2.2)
Albumin: 3.9 g/dL (ref 3.5–5.5)
Alkaline Phosphatase: 47 IU/L (ref 39–117)
BUN/Creatinine Ratio: 15 (ref 9–20)
BUN: 15 mg/dL (ref 6–24)
Bilirubin Total: <0.2 mg/dL (ref 0.0–1.2)
Calcium: 9.2 mg/dL (ref 8.7–10.2)
Chloride: 105 mmol/L (ref 96–106)
Creatinine, Ser: 1 mg/dL (ref 0.76–1.27)
GFR, EST AFRICAN AMERICAN: 95 mL/min/{1.73_m2} (ref >59)
GFR, EST NON AFRICAN AMERICAN: 83 mL/min/{1.73_m2} (ref >59)
GLUCOSE: 102 mg/dL — AB (ref 65–99)
Globulin, Total: 1.9 g/dL (ref 1.5–4.5)
Potassium: 5 mmol/L (ref 3.5–5.2)
Sodium: 141 mmol/L (ref 134–144)
Total Protein: 5.8 g/dL — ABNORMAL LOW (ref 6.0–8.5)

## 2018-10-08 LAB — 25-HYDROXYVITAMIN D LCMS D2+D3

## 2018-10-08 LAB — SEDIMENTATION RATE: Sed Rate: 6 mm/hr (ref 0–30)

## 2018-10-08 LAB — 25-HYDROXY VITAMIN D LCMS D2+D3
25-Hydroxy, Vitamin D-3: 44 ng/mL
25-Hydroxy, Vitamin D: 44 ng/mL

## 2018-10-08 LAB — VITAMIN B12: Vitamin B-12: 481 pg/mL (ref 232–1245)

## 2018-10-08 LAB — MAGNESIUM: Magnesium: 2.2 mg/dL (ref 1.6–2.3)

## 2018-10-09 LAB — COMPLIANCE DRUG ANALYSIS, UR

## 2018-10-15 DIAGNOSIS — M48061 Spinal stenosis, lumbar region without neurogenic claudication: Secondary | ICD-10-CM | POA: Insufficient documentation

## 2018-10-15 DIAGNOSIS — M47816 Spondylosis without myelopathy or radiculopathy, lumbar region: Secondary | ICD-10-CM | POA: Insufficient documentation

## 2018-10-15 DIAGNOSIS — M961 Postlaminectomy syndrome, not elsewhere classified: Secondary | ICD-10-CM | POA: Insufficient documentation

## 2018-10-15 DIAGNOSIS — R937 Abnormal findings on diagnostic imaging of other parts of musculoskeletal system: Secondary | ICD-10-CM | POA: Insufficient documentation

## 2018-10-15 NOTE — Progress Notes (Deleted)
Patient's Name: Nicolas Colon  MRN: [id number]  Referring Provider: [facility]  DOB: 21-Aug-1961  PCP: [facility]  DOS: 10/17/2018  Note by: Gaspar Cola, MD  Service setting: Ambulatory outpatient  Specialty: Interventional Pain Management  Location: [facility] (AMB) Pain Management Facility    Patient type: Established   Primary Reason(s) for Visit: Encounter for evaluation before starting new chronic pain management plan of care (Level of risk: moderate) CC: No chief complaint on file.  HPI  Mr. Nicolas Colon is a 58 y.o. year old, male patient, who comes today for a follow-up evaluation to review the test results and decide on a treatment plan. 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); Ganglion cyst of wrist (Left); Lumbar radiculopathy; OSA on CPAP; Purulent tenosynovitis; S/P lumbar fusion; Chronic wrist pain (Left); Chronic low back pain (Primary Area of Pain) (Bilateral) (R>L) w/o sciatica; Failed back surgical syndrome; Abnormal MRI, lumbar spine (07/14/2017); Lumbar facet hypertrophy (Bilateral: L2-3, L3-4, L5-S1) (Left: L4-5) (Right: L4-5 facetectomy); 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 His primarily concern today is the No chief complaint on file.  Pain Assessment: Location:     Radiating:   Onset:   Duration:   Quality:   Severity:  /10 (subjective,  self-reported pain score)  Note: Reported level is compatible with observation.                         When using our objective Pain Scale, levels between 6 and 10/10 are said to belong in an emergency room, as it progressively worsens from a 6/10, described as severely limiting, requiring emergency care not usually available at an outpatient pain management facility. At a 6/10 level, communication becomes difficult and requires great effort. Assistance to reach the emergency department may be required. Facial flushing and profuse sweating along with potentially dangerous increases in heart rate and blood pressure will be evident. Effect on ADL:   Timing:   Modifying factors:   BP:    HR:    Mr. Nicolas Colon comes in today for a follow-up visit after his initial evaluation on 10/03/2018. Today we went over the results of his tests. These were explained in "Layman's terms". During today's appointment we went over my diagnostic impression, as well as the proposed treatment plan.  According to the patient his primary area of pain is in his lower back.  He admits that he has history of a slipped disc.  He did undergo surgery in January 05, 2017 which included a lumbar fusion.  He admits the surgery was effective for his left leg pain.  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  He has had physical therapy along with chiropractic treatment.  He admits that they both were effective for approximately 2 to 3 months.  His second area of pain is in his legs(B)(R>L).  He admits that the right is currently worse  than the left.  He does have occasional left-sided leg pain and when that occurs it is worse than the right.  He admits the pain goes down the sides of his leg rotates to the front then to the bottom of both feet (S1 Dermatomal pattern).  He has numbness tingling with occasional weakness.  He has had a nerve conduction study through the [facility].  His third area of pain is in his right shoulder.  He feels like it may be a pulled muscle however it has continued.  He denies any previous surgery, interventional therapy, physical therapy or recent images of his shoulder.  In considering the treatment plan options, Mr. Nicolas Colon was reminded that I no longer take patients for medication management only. I asked him to let me know if he had no intention of taking advantage of the interventional therapies, so that we could make arrangements to provide this space to someone interested. I also made it clear that undergoing interventional therapies for the purpose of getting pain medications is very inappropriate on the part of a patient, and it will not be tolerated in this practice. This type of behavior would suggest true addiction and therefore it requires referral to an addiction specialist.   Further details on both, my assessment(s), as well as the proposed treatment plan, please see below.  Controlled Substance Pharmacotherapy Assessment REMS (Risk Evaluation and Mitigation Strategy)  Analgesic: None Highest recorded MME/day: 62.5 mg/day MME/day: 0 mg/day  Pill Count: None expected due to no prior prescriptions written by our practice. No notes on file Pharmacokinetics: Liberation and absorption (onset of action): WNL Distribution (time to peak effect): WNL Metabolism and excretion (duration of action): WNL         Pharmacodynamics: Desired effects: Analgesia: Mr. Nicolas Colon reports >50% benefit. Functional ability: Patient reports that medication allows him to accomplish basic ADLs Clinically meaningful improvement in function (CMIF): Sustained CMIF goals met Perceived effectiveness: Described as relatively effective, allowing for increase in activities of daily living (ADL) Undesirable effects: Side-effects or Adverse reactions: None reported Monitoring: [place] PMP: Online review of the past 27-monthperiod previously conducted. Not  applicable at this point since we have not taken over the patient's medication management yet. List of other Serum/Urine Drug Screening Test(s):  No results found. List of all UDS test(s) done:  Lab Results  Component Value Date   SUMMARY FINAL 10/03/2018   Last UDS on record: Summary  Date Value Ref Range Status  10/03/2018 FINAL  Final    Comment:    ==================================================================== TOXASSURE COMP DRUG ANALYSIS,UR ==================================================================== Specimen Alert Note:  Urinary creatinine is low; ability to detect some drugs may be compromised.  Interpret results with caution. ==================================================================== Test                             Result       Flag       Units Drug Present and Declared for Prescription Verification   Gabapentin                     PRESENT      EXPECTED Drug Absent but Declared for Prescription Verification   Baclofen                       Not Detected UNEXPECTED   Bupropion  Not Detected UNEXPECTED   Duloxetine                     Not Detected UNEXPECTED   Trazodone                      Not Detected UNEXPECTED   Quetiapine                     Not Detected UNEXPECTED   Salicylate                     Not Detected UNEXPECTED    Aspirin, as indicated in the declared medication list, is not    always detected even when used as directed.   Hydroxyzine                    Not Detected UNEXPECTED ==================================================================== Test                      Result    Flag   Units      Ref Range   Creatinine              19        L      mg/dL      >=20 ==================================================================== Declared Medications:  The flagging and interpretation on this report are based on the  following declared medications.  Unexpected results may arise from  inaccuracies in the  declared medications.  **Note: The testing scope of this panel includes these medications:  Baclofen (Lioresal)  Bupropion (Zyban)  Duloxetine (Cymbalta)  Gabapentin  Hydroxyzine (Vistaril)  Quetiapine (Seroquel)  Trazodone (Desyrel)  **Note: The testing scope of this panel does not include small to  moderate amounts of these reported medications:  Aspirin (Aspirin 81)  **Note: The testing scope of this panel does not include following  reported medications:  Albuterol (Combivent)  Betamethasone (Diprolene)  Budesonide (Symbicort)  Cholecalciferol  Divalproex (Depakote)  Eye Drop (Refresh)  Flunisolide  Formoterol (Symbicort)  Ipratropium (Atrovent)  Ipratropium (Combivent)  Ketotifen (Zaditor)  Lisinopril  Meloxicam  Montelukast (Singulair)  Multivitamin  Omeprazole  Prazosin  Ranitidine (Zantac)  Sildenafil (Viagra)  Sodium Chloride  Terazosin (Hytrin) ==================================================================== For clinical consultation, please call [phone]. ====================================================================    UDS interpretation: No unexpected findings.          Medication Assessment Form: Patient introduced to form today Treatment compliance: Treatment may start today if patient agrees with proposed plan. Evaluation of compliance is not applicable at this point Risk Assessment Profile: Aberrant behavior: See initial evaluations. None observed or detected today Comorbid factors increasing risk of overdose: See initial evaluation. No additional risks detected today Opioid risk tool (ORT) (Total Score):   Personal History of Substance Abuse (SUD-Substance use disorder):  Alcohol:    Illegal Drugs:    Rx Drugs:    ORT Risk Level calculation:   Risk of substance use disorder (SUD): Low  ORT Scoring interpretation table:  Score <3 = Low Risk for SUD  Score between 4-7 = Moderate Risk for SUD  Score >8 = High Risk for Opioid  Abuse   Risk Mitigation Strategies:  Patient opioid safety counseling: Completed today. Counseling provided to patient as per "Patient Counseling Document". Document signed by patient, attesting to counseling and understanding Patient-Prescriber Agreement (PPA): Obtained today.  Controlled substance notification to other providers: Written and sent today.  Pharmacologic Plan: Today we may  be taking over the patient's pharmacological regimen. See below.             Laboratory Chemistry  Inflammation Markers (CRP: Acute Phase) (ESR: Chronic Phase) Lab Results  Component Value Date   CRP 3 10/03/2018   ESRSEDRATE 6 10/03/2018                         Rheumatology Markers No results found.  Renal Function Markers Lab Results  Component Value Date   BUN 15 10/03/2018   CREATININE 1.00 10/03/2018   BCR 15 10/03/2018   GFRAA 95 10/03/2018   GFRNONAA 83 10/03/2018                             Hepatic Function Markers Lab Results  Component Value Date   AST 17 10/03/2018   ALBUMIN 3.9 10/03/2018   ALKPHOS 47 10/03/2018                        Electrolytes Lab Results  Component Value Date   NA 141 10/03/2018   K 5.0 10/03/2018   CL 105 10/03/2018   CALCIUM 9.2 10/03/2018   MG 2.2 10/03/2018                        Neuropathy Markers Lab Results  Component Value Date   FGHWEXHB71 696 10/03/2018                        CNS Tests No results found.  Bone Pathology Markers Lab Results  Component Value Date   25OHVITD1 44 10/03/2018   25OHVITD2 <1.0 10/03/2018   25OHVITD3 44 10/03/2018                         Coagulation Parameters No results found.  Cardiovascular Markers No results found.  CA Markers No results found.  Note: Lab results reviewed.  Recent Diagnostic Imaging Review  Shoulder Imaging: Shoulder-R DG:  Results for orders placed during the hospital encounter of 10/03/18  DG Shoulder Right   Narrative CLINICAL DATA:  Chronic right shoulder  pain.  EXAM: RIGHT SHOULDER - 2+ VIEW  COMPARISON:  None.  FINDINGS: Right shoulder is located without a fracture. Irregularity along the posterior right glenoid is most compatible with osteoarthritis. Normal alignment at the right [facility] joint. Cortical thickening involving the right eighth and ninth ribs and suspect old fractures.  IMPRESSION: No acute abnormality to the right shoulder.  Degenerative changes at the glenohumeral joint.   Electronically Signed   By: Markus Daft M.D.   On: 10/04/2018 08:26    Sacroiliac Joint Imaging: Sacroiliac Joint DG:  Results for orders placed during the hospital encounter of 10/03/18  DG Si Joints   Narrative CLINICAL DATA:  Chronic sacroiliac joint pain.  EXAM: BILATERAL SACROILIAC JOINTS - 3+ VIEW  COMPARISON:  None.  FINDINGS: The sacroiliac joint spaces are maintained and there is no evidence of arthropathy. No other bone abnormalities are seen.  IMPRESSION: Negative.   Electronically Signed   By: Marijo Conception, M.D.   On: 10/04/2018 08:22    Complexity Note: Imaging results reviewed. Results shared with Mr. Burnham, using Layman's terms.  Meds   Current Outpatient Medications:  .  aspirin 81 MG chewable tablet, Chew 81 mg by mouth daily., Disp: , Rfl:  .  augmented betamethasone dipropionate (DIPROLENE-AF) 0.05 % ointment, Apply topically 2 (two) times daily., Disp: , Rfl:  .  baclofen (LIORESAL) 10 MG tablet, Take 10 mg by mouth 3 (three) times daily., Disp: , Rfl:  .  budesonide-formoterol (SYMBICORT) 160-4.5 MCG/ACT inhaler, Inhale 2 puffs into the lungs 2 (two) times daily., Disp: , Rfl:  .  buPROPion (ZYBAN) 150 MG 12 hr tablet, Take 150 mg by mouth daily., Disp: , Rfl:  .  carboxymethylcellulose (REFRESH PLUS) 0.5 % SOLN, Place 1 drop into both eyes 3 (three) times daily as needed., Disp: , Rfl:  .  divalproex (DEPAKOTE) 500 MG DR tablet, Take 500 mg by mouth at bedtime., Disp: , Rfl:  .   DULoxetine (CYMBALTA) 60 MG capsule, Take 60 mg by mouth daily., Disp: , Rfl:  .  FLUNISOLIDE, NASAL, NA, Place 1 spray into the nose 2 (two) times daily., Disp: , Rfl:  .  gabapentin (NEURONTIN) 300 MG capsule, Take 300 mg by mouth 3 (three) times daily., Disp: , Rfl:  .  hydrOXYzine (VISTARIL) 25 MG capsule, Take 25 mg by mouth 3 (three) times daily as needed., Disp: , Rfl:  .  ipratropium (ATROVENT) 0.03 % nasal spray, Place 2 sprays into both nostrils every 12 (twelve) hours., Disp: , Rfl:  .  Ipratropium-Albuterol (COMBIVENT) 20-100 MCG/ACT AERS respimat, Inhale 1 puff into the lungs every 6 (six) hours., Disp: , Rfl:  .  ketotifen (ZADITOR) 0.025 % ophthalmic solution, Place 1 drop into both eyes 2 (two) times daily., Disp: , Rfl:  .  lisinopril (PRINIVIL,ZESTRIL) 10 MG tablet, Take 10 mg by mouth daily., Disp: , Rfl:  .  meloxicam (MOBIC) 15 MG tablet, Take 15 mg by mouth daily., Disp: , Rfl:  .  montelukast (SINGULAIR) 10 MG tablet, Take 10 mg by mouth at bedtime., Disp: , Rfl:  .  Multiple Vitamins-Minerals (MULTIVITAMINS/MINERALS ADULT PO), Take 1 tablet by mouth daily., Disp: , Rfl:  .  omeprazole (PRILOSEC) 20 MG capsule, Take 20 mg by mouth 2 (two) times daily before a meal., Disp: , Rfl:  .  prazosin (MINIPRESS) 1 MG capsule, Take 1 mg by mouth at bedtime., Disp: , Rfl:  .  QUEtiapine (SEROQUEL) 100 MG tablet, Take 100 mg by mouth at bedtime., Disp: , Rfl:  .  ranitidine (ZANTAC) 150 MG capsule, Take 150 mg by mouth every evening., Disp: , Rfl:  .  sildenafil (VIAGRA) 100 MG tablet, Take 100 mg by mouth daily as needed for erectile dysfunction., Disp: , Rfl:  .  sodium chloride 0.9 % nebulizer solution, Take 3 mLs by nebulization every 6 (six) hours as needed for wheezing., Disp: , Rfl:  .  terazosin (HYTRIN) 2 MG capsule, Take 2 mg by mouth at bedtime., Disp: , Rfl:  .  traZODone (DESYREL) 100 MG tablet, Take 100 mg by mouth at bedtime., Disp: , Rfl:  .  Vitamin D, Cholecalciferol,  50 MCG (2000 UT) CAPS, Take 4,000 Units by mouth daily., Disp: , Rfl:   ROS  Constitutional: Denies any fever or chills Gastrointestinal: No reported hemesis, hematochezia, vomiting, or acute GI distress Musculoskeletal: Denies any acute onset joint swelling, redness, loss of ROM, or weakness Neurological: No reported episodes of acute onset apraxia, aphasia, dysarthria, agnosia, amnesia, paralysis, loss of coordination, or loss of consciousness  Allergies  Mr. Hannay is allergic  to aspirin; grass extracts [gramineae pollens]; hydrocodone; keflex [cephalexin]; and niacin and related.  [place]  Drug: Mr. Corcoran  reports current drug use. Drug: Marijuana. Alcohol:  reports previous alcohol use. Tobacco:  reports that he has been smoking. He started smoking about 13 years ago. He has been smoking about 0.50 packs per day. He has never used smokeless tobacco. Medical:  has a past medical history of Allergy, Anxiety, Arthritis, Chronic bilateral low back pain with bilateral sciatica (Primary Area of Pain) (R>L) (10/03/2018), Depression, GERD (gastroesophageal reflux disease), Sleep apnea, and Substance abuse ([place]). Surgical: Mr. Steffenhagen  has a past surgical history that includes Carpal tunnel release (Left, 2014) and Back surgery (01/05/2017). Family: family history includes Kidney disease in his brother.  Constitutional Exam  General appearance: Well nourished, well developed, and well hydrated. In no apparent acute distress There were no vitals filed for this visit. BMI Assessment: Estimated body mass index is 27.37 kg/m as calculated from the following:   Height as of 10/03/18: _0  (1.727 m).   Weight as of 10/03/18: 180 lb (81.6 kg).  BMI interpretation table: BMI level Category Range association with higher incidence of chronic pain  <18 kg/m2 Underweight   18.5-24.9 kg/m2 Ideal body weight   25-29.9 kg/m2 Overweight Increased incidence by 20%  30-34.9 kg/m2 Obese (Class I) Increased incidence  by 68%  35-39.9 kg/m2 Severe obesity (Class II) Increased incidence by 136%  >40 kg/m2 Extreme obesity (Class III) Increased incidence by 254%   Patient's current BMI Ideal Body weight  There is no height or weight on file to calculate BMI. Ideal body weight: 68.4 kg (150 lb 12.7 oz) Adjusted ideal body weight: 73.7 kg (162 lb 7.6 oz)   BMI Readings from Last 4 Encounters:  10/03/18 27.37 kg/m   Wt Readings from Last 4 Encounters:  10/03/18 180 lb (81.6 kg)  Psych/Mental status: Alert, oriented x 3 (person, place, & time)       Eyes: PERLA Respiratory: No evidence of acute respiratory distress  Cervical Spine Area Exam  Skin & Axial Inspection: No masses, redness, edema, swelling, or associated skin lesions Alignment: Symmetrical Functional ROM: Unrestricted ROM      Stability: No instability detected Muscle Tone/Strength: Functionally intact. No obvious neuro-muscular anomalies detected. Sensory (Neurological): Unimpaired Palpation: No palpable anomalies              Upper Extremity (UE) Exam    Side: Right upper extremity  Side: Left upper extremity  Skin & Extremity Inspection: Skin color, temperature, and hair growth are WNL. No peripheral edema or cyanosis. No masses, redness, swelling, asymmetry, or associated skin lesions. No contractures.  Skin & Extremity Inspection: Skin color, temperature, and hair growth are WNL. No peripheral edema or cyanosis. No masses, redness, swelling, asymmetry, or associated skin lesions. No contractures.  Functional ROM: Unrestricted ROM          Functional ROM: Unrestricted ROM          Muscle Tone/Strength: Functionally intact. No obvious neuro-muscular anomalies detected.  Muscle Tone/Strength: Functionally intact. No obvious neuro-muscular anomalies detected.  Sensory (Neurological): Unimpaired          Sensory (Neurological): Unimpaired          Palpation: No palpable anomalies              Palpation: No palpable anomalies               Provocative Test(s):  Phalen's test: deferred Tinel's test: deferred Apley's scratch  test (touch opposite shoulder):  Action 1 (Across chest): deferred Action 2 (Overhead): deferred Action 3 (LB reach): deferred   Provocative Test(s):  Phalen's test: deferred Tinel's test: deferred Apley's scratch test (touch opposite shoulder):  Action 1 (Across chest): deferred Action 2 (Overhead): deferred Action 3 (LB reach): deferred    Thoracic Spine Area Exam  Skin & Axial Inspection: No masses, redness, or swelling Alignment: Symmetrical Functional ROM: Unrestricted ROM Stability: No instability detected Muscle Tone/Strength: Functionally intact. No obvious neuro-muscular anomalies detected. Sensory (Neurological): Unimpaired Muscle strength & Tone: No palpable anomalies  Lumbar Spine Area Exam  Skin & Axial Inspection: No masses, redness, or swelling Alignment: Symmetrical Functional ROM: Unrestricted ROM       Stability: No instability detected Muscle Tone/Strength: Functionally intact. No obvious neuro-muscular anomalies detected. Sensory (Neurological): Unimpaired Palpation: No palpable anomalies       Provocative Tests: Hyperextension/rotation test: deferred today       Lumbar quadrant test (Kemp's test): deferred today       Lateral bending test: deferred today       Patrick's Maneuver: deferred today                   FABER* test: deferred today                   S-I anterior distraction/compression test: deferred today         S-I lateral compression test: deferred today         S-I Thigh-thrust test: deferred today         S-I Gaenslen's test: deferred today         *(Flexion, ABduction and External Rotation)  Gait & Posture Assessment  Ambulation: Unassisted Gait: Relatively normal for age and body habitus Posture: WNL   Lower Extremity Exam    Side: Right lower extremity  Side: Left lower extremity  Stability: No instability observed          Stability: No  instability observed          Skin & Extremity Inspection: Skin color, temperature, and hair growth are WNL. No peripheral edema or cyanosis. No masses, redness, swelling, asymmetry, or associated skin lesions. No contractures.  Skin & Extremity Inspection: Skin color, temperature, and hair growth are WNL. No peripheral edema or cyanosis. No masses, redness, swelling, asymmetry, or associated skin lesions. No contractures.  Functional ROM: Unrestricted ROM                  Functional ROM: Unrestricted ROM                  Muscle Tone/Strength: Functionally intact. No obvious neuro-muscular anomalies detected.  Muscle Tone/Strength: Functionally intact. No obvious neuro-muscular anomalies detected.  Sensory (Neurological): Unimpaired        Sensory (Neurological): Unimpaired        DTR: Patellar: deferred today Achilles: deferred today Plantar: deferred today  DTR: Patellar: deferred today Achilles: deferred today Plantar: deferred today  Palpation: No palpable anomalies  Palpation: No palpable anomalies   Assessment & Plan  Primary Diagnosis & Pertinent Problem List: The primary encounter diagnosis was Chronic pain syndrome. Diagnoses of Chronic low back pain (Primary Area of Pain) (Bilateral) (R>L) w/o sciatica, Failed back surgical syndrome, Lumbar central spinal stenosis (L3-4, L4-5, L5-S1) w/o claudication, Chronic lower extremity pain (Secondary Area of Pain) (Bilateral) (R>L), Lumbar foraminal stenosis (Bilateral: L3-4, L5-S1) (Right: T11-12) (Left: L4-5), Lumbar lateral recess stenosis (Bilateral: L2-3, L3-4, L5-S1) (Right: T11-12) (  Left: L4-5), Lumbar facet hypertrophy (Bilateral: L2-3, L3-4, L5-S1) (Left: L4-5) (Right: L4-5 facetectomy), Chronic shoulder pain (Tertiary Area of Pain) (Right), and Abnormal MRI, lumbar spine (07/14/2017) were also pertinent to this visit.  Visit Diagnosis: 1. Chronic pain syndrome   2. Chronic low back pain (Primary Area of Pain) (Bilateral) (R>L) w/o  sciatica   3. Failed back surgical syndrome   4. 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 Chronic lower extremity pain (Secondary Area of Pain) (Bilateral) (R>L)   6. Lumbar foraminal stenosis (Bilateral: L3-4, L5-S1) (Right: T11-12) (Left: L4-5)   7. Lumbar lateral recess stenosis (Bilateral: L2-3, L3-4, L5-S1) (Right: T11-12) (Left: L4-5)   8. Lumbar facet hypertrophy (Bilateral: L2-3, L3-4, L5-S1) (Left: L4-5) (Right: L4-5 facetectomy)   9. Chronic shoulder pain (Tertiary Area of Pain) (Right)   10. Abnormal MRI, lumbar spine (07/14/2017)    Problems updated and reviewed during this visit: Problem  Lumbar facet hypertrophy (Bilateral: L2-3, L3-4, L5-S1) (Left: L4-5) (Right: L4-5 facetectomy)   L2-3: facet hypertrophy L3-4: facet hypertrophy L4-5: Status post right facetectomy L5-S1: facet hypertrophy   Lumbar lateral recess stenosis (Bilateral: L2-3, L3-4, L5-S1) (Right: T11-12) (Left: L4-5)   T11-12: Narrowing and effacement of the right subarticular recess. L2-3: Minimal effacement of the lateral recesses. L3-4: Moderate subarticular recess stenosis. L4-5: Moderate narrowing of the left lateral recess. Disc bulge contacts the descending left L5 nerve root in the lateral recess. L5-S1: Moderate lateral recess stenosis.   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 L3-4: Mild bilateral neural foraminal narrowing. L4-5: Mild left neural foraminal narrowing. L5-S1: Mild bilateral neural foraminal narrowing.     Plan of Care  Pharmacotherapy (Medications Ordered): No orders of the defined types were placed in this encounter.  Procedure Orders    No procedure(s) ordered today   Lab Orders  No laboratory test(s) ordered today   Imaging Orders  No imaging studies ordered today   Referral Orders  No referral(s) requested today    Pharmacological  management options:  Opioid Analgesics: We'll take over management today. See above orders Membrane stabilizer: We have discussed the possibility of optimizing this mode of therapy, if tolerated Muscle relaxant: We have discussed the possibility of a trial NSAID: We have discussed the possibility of a trial Other analgesic(s): To be determined at a later time   Interventional management options: Planned, scheduled, and/or pending:    ***   Considering:   Diagnostic midline LESI  Diagnostic bilateral lumbar facet block  Possible bilateral lumbar facet RFA  Diagnostic right sacroiliac joint injection  Possible right sacroiliac joint RFA    PRN Procedures:   None at this time   Provider-requested follow-up: No follow-ups on file.  Future Appointments  Date Time Provider [place]  10/17/2018 10:30 AM Milinda Pointer, MD [facility] None    Primary Care Physician: [place] Location: [facility] Note by: Gaspar Cola, MD Date: 10/17/2018; Time: 8:35 AM

## 2018-10-17 ENCOUNTER — Ambulatory Visit: Payer: Non-veteran care | Admitting: Pain Medicine

## 2018-11-05 ENCOUNTER — Ambulatory Visit
Admission: RE | Admit: 2018-11-05 | Discharge: 2018-11-05 | Disposition: A | Discharge status: Home / Self Care | Payer: No Typology Code available for payment source | Source: Ambulatory Visit | Attending: Pain Medicine | Admitting: Pain Medicine

## 2018-11-05 ENCOUNTER — Encounter: Payer: Self-pay | Admitting: Pain Medicine

## 2018-11-05 ENCOUNTER — Other Ambulatory Visit: Payer: Self-pay

## 2018-11-05 ENCOUNTER — Ambulatory Visit (HOSPITAL_BASED_OUTPATIENT_CLINIC_OR_DEPARTMENT_OTHER): Payer: No Typology Code available for payment source | Admitting: Pain Medicine

## 2018-11-05 VITALS — BP 152/66 | HR 78 | Temp 98.2°F | Resp 18 | Ht 68.0 in | Wt 183.0 lb

## 2018-11-05 DIAGNOSIS — M7918 Myalgia, other site: Secondary | ICD-10-CM | POA: Insufficient documentation

## 2018-11-05 DIAGNOSIS — M25551 Pain in right hip: Secondary | ICD-10-CM | POA: Insufficient documentation

## 2018-11-05 DIAGNOSIS — Z789 Other specified health status: Secondary | ICD-10-CM | POA: Insufficient documentation

## 2018-11-05 DIAGNOSIS — G894 Chronic pain syndrome: Secondary | ICD-10-CM

## 2018-11-05 DIAGNOSIS — M961 Postlaminectomy syndrome, not elsewhere classified: Secondary | ICD-10-CM | POA: Insufficient documentation

## 2018-11-05 DIAGNOSIS — M5442 Lumbago with sciatica, left side: Principal | ICD-10-CM

## 2018-11-05 DIAGNOSIS — M5136 Other intervertebral disc degeneration, lumbar region: Secondary | ICD-10-CM

## 2018-11-05 DIAGNOSIS — M67432 Ganglion, left wrist: Secondary | ICD-10-CM | POA: Insufficient documentation

## 2018-11-05 DIAGNOSIS — M24541 Contracture, right hand: Secondary | ICD-10-CM

## 2018-11-05 DIAGNOSIS — M51369 Other intervertebral disc degeneration, lumbar region without mention of lumbar back pain or lower extremity pain: Secondary | ICD-10-CM | POA: Insufficient documentation

## 2018-11-05 DIAGNOSIS — M5441 Lumbago with sciatica, right side: Principal | ICD-10-CM

## 2018-11-05 DIAGNOSIS — G8929 Other chronic pain: Secondary | ICD-10-CM

## 2018-11-05 DIAGNOSIS — M25511 Pain in right shoulder: Secondary | ICD-10-CM

## 2018-11-05 DIAGNOSIS — M48061 Spinal stenosis, lumbar region without neurogenic claudication: Secondary | ICD-10-CM | POA: Insufficient documentation

## 2018-11-05 DIAGNOSIS — M47816 Spondylosis without myelopathy or radiculopathy, lumbar region: Secondary | ICD-10-CM | POA: Insufficient documentation

## 2018-11-05 DIAGNOSIS — M899 Disorder of bone, unspecified: Secondary | ICD-10-CM

## 2018-11-05 DIAGNOSIS — M79605 Pain in left leg: Secondary | ICD-10-CM | POA: Insufficient documentation

## 2018-11-05 DIAGNOSIS — M79604 Pain in right leg: Secondary | ICD-10-CM | POA: Insufficient documentation

## 2018-11-05 DIAGNOSIS — M792 Neuralgia and neuritis, unspecified: Secondary | ICD-10-CM | POA: Insufficient documentation

## 2018-11-05 DIAGNOSIS — M160 Bilateral primary osteoarthritis of hip: Secondary | ICD-10-CM

## 2018-11-05 DIAGNOSIS — R937 Abnormal findings on diagnostic imaging of other parts of musculoskeletal system: Secondary | ICD-10-CM

## 2018-11-05 DIAGNOSIS — M25552 Pain in left hip: Secondary | ICD-10-CM

## 2018-11-05 DIAGNOSIS — Z9989 Dependence on other enabling machines and devices: Secondary | ICD-10-CM

## 2018-11-05 DIAGNOSIS — M25532 Pain in left wrist: Secondary | ICD-10-CM

## 2018-11-05 DIAGNOSIS — Z79899 Other long term (current) drug therapy: Secondary | ICD-10-CM

## 2018-11-05 DIAGNOSIS — G4733 Obstructive sleep apnea (adult) (pediatric): Secondary | ICD-10-CM | POA: Insufficient documentation

## 2018-11-05 MED ORDER — GABAPENTIN 300 MG PO CAPS: 300.0000 mg | ORAL_CAPSULE | Freq: Four times a day (QID) | ORAL | 2 refills | Status: AC

## 2018-11-05 MED ORDER — MELOXICAM 15 MG PO TABS: 15.0000 mg | ORAL_TABLET | Freq: Every day | ORAL | 2 refills | Status: AC

## 2018-11-05 MED ORDER — BACLOFEN 10 MG PO TABS: 10.0000 mg | ORAL_TABLET | Freq: Four times a day (QID) | ORAL | 2 refills | Status: AC

## 2018-11-05 NOTE — Progress Notes (Signed)
Safety precautions to be maintained throughout the outpatient stay will include: orient to surroundings, keep bed in low position, maintain call bell within reach at all times, provide assistance with transfer out of bed and ambulation.  

## 2018-11-05 NOTE — Progress Notes (Signed)
Patient's Name: Nicolas Colon  MRN: [id number]  Referring Provider: [facility]  DOB: 23-Sep-1961  PCP: [facility]  DOS: 11/05/2018  Note by: Gaspar Cola, MD  Service setting: Ambulatory outpatient  Specialty: Interventional Pain Management  Location: [facility] (AMB) Pain Management Facility    Patient type: Established   Primary Reason(s) for Visit: Encounter for evaluation before starting new chronic pain management plan of care (Level of risk: moderate) CC: Back Pain (low) and Leg Pain (right, lateral and anterior)  HPI  Nicolas Colon is a 58 y.o. year old, male patient, who comes today for a follow-up evaluation to review the test results and decide on a treatment plan. 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); Ganglion cyst of wrist (Left); Lumbar radiculopathy; OSA on CPAP; Purulent tenosynovitis; S/P lumbar fusion; Chronic wrist pain (Left); Chronic low back pain (Primary Area of Pain) (Bilateral) (R>L) w/o sciatica; Failed back surgical syndrome; Abnormal MRI, lumbar spine (07/14/2017); Lumbar facet hypertrophy (Bilateral: L2-3, L3-4, L5-S1) (Left: L4-5) (Right: L4-5 facetectomy); 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 His primarily concern today is the Back Pain (low) and Leg Pain (right, lateral and anterior)  Pain Assessment: Location: Lower Back Radiating: right leg Onset: More than a month ago Duration: Chronic pain Quality: Constant, Sharp, Burning, Pins and needles, Nagging, Throbbing, Stabbing Severity: 4 /10 (subjective, self-reported pain score)  Note: Reported level is inconsistent with clinical observations. Clinically the patient looks like a 3/10 A 3/10 is viewed as "Moderate" and described as significantly interfering with activities of daily living (ADL). It becomes difficult to feed, bathe, get dressed, get on and off the toilet or to perform personal hygiene functions. Difficult to get in and out of bed or a chair without assistance. Very distracting. With effort, it can be ignored when deeply involved in activities. Information on the proper use of the pain scale provided to the patient today. When using our objective Pain Scale, levels between 6 and 10/10 are said to belong in an emergency room, as it progressively worsens from a 6/10, described as severely limiting, requiring emergency care not usually available at an outpatient pain management facility. At a 6/10 level, communication becomes difficult and requires great effort. Assistance to reach the emergency department may be required. Facial flushing and profuse sweating along with potentially dangerous increases in heart rate and blood pressure will be evident. Timing: Constant Modifying factors: medications, stretching, rest BP: (!) 152/66  HR: 78  58 comes in today for a follow-up visit after his initial evaluation on 10/17/2018. Today we went over the results of his tests. These were explained in "Layman's terms". During today's appointment we went over my diagnostic impression, as well as the proposed treatment plan.  According to the patient his primary area of pain is  in his lower back (B) (R>L).  He admits that he has history of a slipped disc.  He did undergo surgery is January 05, 2017 which included a lumbar fusion.  He admits the surgery was effective for his left leg pain.  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  He has had physical therapy along with chiropractic treatment.  He admits that they both were effective for approximately 2 to 3 months.  His second area of pain is in his legs (B) (R>L).  He admits that the right is currently worse than the left.  He does have occasional left-sided leg pain and when that occurs it is worse than the right.  He admits the pain goes down the sides of his leg rotates to the front then to the bottom of both feet (S1).  Has a diagnosis of bilateral plantar fascitis x 30 yrs. He has numbness tingling with occasional weakness.  He has had a nerve conduction study through the [facility], [place]).  His third area of pain is in his right shoulder (R).  He feels like it may be a pulled muscle however it has continued.  He denies any previous surgery, interventional therapy, physical therapy or recent images of his shoulder.  In considering the treatment plan options, Nicolas Colon was reminded that I no longer take patients for medication management only. I asked him to let me know if he had no intention of taking advantage of the interventional therapies, so that we could make arrangements to provide this space to someone interested. I also made it clear that undergoing interventional therapies for the purpose of getting pain medications is very inappropriate on the part of a patient, and it will not be tolerated in this practice. This type of behavior would suggest true addiction and therefore it requires referral to an addiction specialist.   Further details on both, my assessment(s), as well as the proposed treatment plan, please see below.  Controlled  Substance Pharmacotherapy Assessment REMS (Risk Evaluation and Mitigation Strategy)  Analgesic: None Highest recorded MME/day: 62.5 mg/day MME/day: 0 mg/day  Pill Count: None expected due to no prior prescriptions written by our practice. Hart Rochester, RN  11/05/2018 10:55 AM  Sign when Signing Visit Safety precautions to be maintained throughout the outpatient stay will include: orient to surroundings, keep bed in low position, maintain call bell within reach at all times, provide assistance with transfer out of bed and ambulation.    Pharmacokinetics: Liberation and absorption (onset of action): WNL Distribution (time to peak effect): WNL Metabolism and excretion (duration of action): WNL         Pharmacodynamics: Desired effects: Analgesia: Mr. Giangrande reports >50% benefit. Functional ability: Patient reports that medication allows him to accomplish basic ADLs Clinically meaningful improvement in function (CMIF): Sustained CMIF goals met Perceived effectiveness: Described as relatively effective, allowing for increase in activities of daily living (ADL) Undesirable effects: Side-effects or Adverse reactions: None reported Monitoring: [place] PMP: Online review of the past 30-monthperiod previously conducted. Not applicable at this point since we have not taken over the patient's medication management yet. List of other Serum/Urine Drug Screening Test(s):  No results found. List of all UDS test(s) done:  Lab Results  Component Value Date   SUMMARY FINAL 10/03/2018   Last UDS on record: Summary  Date Value Ref Range Status  10/03/2018 FINAL  Final    Comment:    ==================================================================== TOXASSURE COMP  DRUG ANALYSIS,UR ==================================================================== Specimen Alert Note:  Urinary creatinine is low; ability to detect some drugs may be compromised.  Interpret results with  caution. ==================================================================== Test                             Result       Flag       Units Drug Present and Declared for Prescription Verification   Gabapentin                     PRESENT      EXPECTED Drug Absent but Declared for Prescription Verification   Baclofen                       Not Detected UNEXPECTED   Bupropion                      Not Detected UNEXPECTED   Duloxetine                     Not Detected UNEXPECTED   Trazodone                      Not Detected UNEXPECTED   Quetiapine                     Not Detected UNEXPECTED   Salicylate                     Not Detected UNEXPECTED    Aspirin, as indicated in the declared medication list, is not    always detected even when used as directed.   Hydroxyzine                    Not Detected UNEXPECTED ==================================================================== Test                      Result    Flag   Units      Ref Range   Creatinine              19        L      mg/dL      >=20 ==================================================================== Declared Medications:  The flagging and interpretation on this report are based on the  following declared medications.  Unexpected results may arise from  inaccuracies in the declared medications.  **Note: The testing scope of this panel includes these medications:  Baclofen (Lioresal)  Bupropion (Zyban)  Duloxetine (Cymbalta)  Gabapentin  Hydroxyzine (Vistaril)  Quetiapine (Seroquel)  Trazodone (Desyrel)  **Note: The testing scope of this panel does not include small to  moderate amounts of these reported medications:  Aspirin (Aspirin 81)  **Note: The testing scope of this panel does not include following  reported medications:  Albuterol (Combivent)  Betamethasone (Diprolene)  Budesonide (Symbicort)  Cholecalciferol  Divalproex (Depakote)  Eye Drop (Refresh)  Flunisolide  Formoterol (Symbicort)  Ipratropium  (Atrovent)  Ipratropium (Combivent)  Ketotifen (Zaditor)  Lisinopril  Meloxicam  Montelukast (Singulair)  Multivitamin  Omeprazole  Prazosin  Ranitidine (Zantac)  Sildenafil (Viagra)  Sodium Chloride  Terazosin (Hytrin) ==================================================================== For clinical consultation, please call [phone]. ====================================================================    UDS interpretation: Unexpected findings not considered significantly abnormal.          Medication Assessment Form: Not applicable. No opioids. Treatment compliance: Not applicable Risk Assessment Profile: Aberrant  behavior: See initial evaluations. None observed or detected today Comorbid factors increasing risk of overdose: See initial evaluation. No additional risks detected today.  The patient is at a higher risk due to his obstructive sleep apnea and psychiatric history. Opioid risk tool (ORT):  Opioid Risk  11/05/2018  Alcohol 3  Illegal Drugs 3  Rx Drugs 0  Alcohol 0  Illegal Drugs 4  Rx Drugs 0  Age between 16-45 years  0  History of Preadolescent Sexual Abuse 0  Psychological Disease 2  ADD Negative  OCD Negative  Bipolar Positive  Depression 1  Opioid Risk Tool Scoring 13  Opioid Risk Interpretation High Risk    ORT Scoring interpretation table:  Score <3 = Low Risk for SUD  Score between 4-7 = Moderate Risk for SUD  Score >8 = High Risk for Opioid Abuse   Risk of substance use disorder (SUD): High  Risk Mitigation Strategies:  Patient opioid safety counseling: No controlled substances prescribed. Patient-Prescriber Agreement (PPA): No agreement signed.  Controlled substance notification to other providers: None required. No opioid therapy.  Pharmacologic Plan: Non-opioid analgesic therapy offered. I have informed the patient that we will try to stay away from controlled substances, as much as we can.  Laboratory Chemistry  Inflammation  Markers (CRP: Acute Phase) (ESR: Chronic Phase) Lab Results  Component Value Date   CRP 3 10/03/2018   ESRSEDRATE 6 10/03/2018                         Rheumatology Markers No results found.  Renal Function Markers Lab Results  Component Value Date   BUN 15 10/03/2018   CREATININE 1.00 10/03/2018   BCR 15 10/03/2018   GFRAA 95 10/03/2018   GFRNONAA 83 10/03/2018                             Hepatic Function Markers Lab Results  Component Value Date   AST 17 10/03/2018   ALBUMIN 3.9 10/03/2018   ALKPHOS 47 10/03/2018                        Electrolytes Lab Results  Component Value Date   NA 141 10/03/2018   K 5.0 10/03/2018   CL 105 10/03/2018   CALCIUM 9.2 10/03/2018   MG 2.2 10/03/2018                        Neuropathy Markers Lab Results  Component Value Date   [id number] 127 10/03/2018                        CNS Tests No results found.  Bone Pathology Markers Lab Results  Component Value Date   25OHVITD1 44 10/03/2018   25OHVITD2 <1.0 10/03/2018   25OHVITD3 44 10/03/2018                         Coagulation Parameters No results found.  Cardiovascular Markers No results found.  CA Markers No results found.  Endocrine Markers No results found.  Note: Lab results reviewed and explained to patient in Layman's terms.  Recent Diagnostic Imaging Review  Shoulder Imaging: Shoulder-R DG:  Results for orders placed during the hospital encounter of 10/03/18  DG Shoulder Right   Narrative CLINICAL DATA:  Chronic right shoulder pain.  EXAM: RIGHT SHOULDER -  2+ VIEW  COMPARISON:  None.  FINDINGS: Right shoulder is located without a fracture. Irregularity along the posterior right glenoid is most compatible with osteoarthritis. Normal alignment at the right [facility] joint. Cortical thickening involving the right eighth and ninth ribs and suspect old fractures.  IMPRESSION: No acute abnormality to the right shoulder.  Degenerative changes at the  glenohumeral joint.   Electronically Signed   By: Markus Daft M.D.   On: 10/04/2018 08:26    Sacroiliac Joint Imaging: Sacroiliac Joint DG:  Results for orders placed during the hospital encounter of 10/03/18  DG Si Joints   Narrative CLINICAL DATA:  Chronic sacroiliac joint pain.  EXAM: BILATERAL SACROILIAC JOINTS - 3+ VIEW  COMPARISON:  None.  FINDINGS: The sacroiliac joint spaces are maintained and there is no evidence of arthropathy. No other bone abnormalities are seen.  IMPRESSION: Negative.   Electronically Signed   By: Marijo Conception, M.D.   On: 10/04/2018 08:22    Complexity Note: Imaging results reviewed. Results shared with Mr. Bickford, using Layman's terms.                         Meds   Current Outpatient Medications:  .  aspirin 81 MG chewable tablet, Chew 81 mg by mouth daily., Disp: , Rfl:  .  augmented betamethasone dipropionate (DIPROLENE-AF) 0.05 % ointment, Apply topically 2 (two) times daily., Disp: , Rfl:  .  baclofen (LIORESAL) 10 MG tablet, Take 1 tablet (10 mg total) by mouth 4 (four) times daily., Disp: 120 tablet, Rfl: 2 .  budesonide-formoterol (SYMBICORT) 160-4.5 MCG/ACT inhaler, Inhale 2 puffs into the lungs 2 (two) times daily., Disp: , Rfl:  .  buPROPion (ZYBAN) 150 MG 12 hr tablet, Take 150 mg by mouth daily., Disp: , Rfl:  .  carboxymethylcellulose (REFRESH PLUS) 0.5 % SOLN, Place 1 drop into both eyes 3 (three) times daily as needed., Disp: , Rfl:  .  divalproex (DEPAKOTE) 500 MG DR tablet, Take 500 mg by mouth at bedtime., Disp: , Rfl:  .  DULoxetine (CYMBALTA) 60 MG capsule, Take 60 mg by mouth daily., Disp: , Rfl:  .  FLUNISOLIDE, NASAL, NA, Place 1 spray into the nose 2 (two) times daily., Disp: , Rfl:  .  gabapentin (NEURONTIN) 300 MG capsule, Take 1 capsule (300 mg total) by mouth 4 (four) times daily., Disp: 120 capsule, Rfl: 2 .  hydrOXYzine (VISTARIL) 25 MG capsule, Take 25 mg by mouth 3 (three) times daily as needed., Disp: ,  Rfl:  .  ipratropium (ATROVENT) 0.03 % nasal spray, Place 2 sprays into both nostrils every 12 (twelve) hours., Disp: , Rfl:  .  Ipratropium-Albuterol (COMBIVENT) 20-100 MCG/ACT AERS respimat, Inhale 1 puff into the lungs every 6 (six) hours., Disp: , Rfl:  .  ketotifen (ZADITOR) 0.025 % ophthalmic solution, Place 1 drop into both eyes 2 (two) times daily., Disp: , Rfl:  .  lisinopril (PRINIVIL,ZESTRIL) 10 MG tablet, Take 10 mg by mouth daily., Disp: , Rfl:  .  meloxicam (MOBIC) 15 MG tablet, Take 1 tablet (15 mg total) by mouth daily., Disp: 30 tablet, Rfl: 2 .  montelukast (SINGULAIR) 10 MG tablet, Take 10 mg by mouth at bedtime., Disp: , Rfl:  .  Multiple Vitamins-Minerals (MULTIVITAMINS/MINERALS ADULT PO), Take 1 tablet by mouth daily., Disp: , Rfl:  .  omeprazole (PRILOSEC) 20 MG capsule, Take 20 mg by mouth 2 (two) times daily before a meal., Disp: , Rfl:  .  prazosin (MINIPRESS) 1 MG capsule, Take 1 mg by mouth at bedtime., Disp: , Rfl:  .  QUEtiapine (SEROQUEL) 100 MG tablet, Take 100 mg by mouth at bedtime., Disp: , Rfl:  .  ranitidine (ZANTAC) 150 MG capsule, Take 150 mg by mouth every evening., Disp: , Rfl:  .  sildenafil (VIAGRA) 100 MG tablet, Take 100 mg by mouth daily as needed for erectile dysfunction., Disp: , Rfl:  .  sodium chloride 0.9 % nebulizer solution, Take 3 mLs by nebulization every 6 (six) hours as needed for wheezing., Disp: , Rfl:  .  terazosin (HYTRIN) 2 MG capsule, Take 2 mg by mouth at bedtime., Disp: , Rfl:  .  traZODone (DESYREL) 100 MG tablet, Take 100 mg by mouth at bedtime., Disp: , Rfl:  .  Vitamin D, Cholecalciferol, 50 MCG (2000 UT) CAPS, Take 4,000 Units by mouth daily., Disp: , Rfl:   ROS  Constitutional: Denies any fever or chills Gastrointestinal: No reported hemesis, hematochezia, vomiting, or acute GI distress Musculoskeletal: Denies any acute onset joint swelling, redness, loss of ROM, or weakness Neurological: No reported episodes of acute onset  apraxia, aphasia, dysarthria, agnosia, amnesia, paralysis, loss of coordination, or loss of consciousness  Allergies  Mr. Klopf is allergic to aspirin; grass extracts [gramineae pollens]; hydrocodone; keflex [cephalexin]; and niacin and related.  [place]  Drug: Mr. Erbe  reports current drug use. Drug: Marijuana. Alcohol:  reports previous alcohol use. Tobacco:  reports that he has been smoking. He started smoking about 13 years ago. He has been smoking about 0.50 packs per day. He has never used smokeless tobacco. Medical:  has a past medical history of Allergy, Anxiety, Arthritis, Chronic bilateral low back pain with bilateral sciatica (Primary Area of Pain) (R>L) (10/03/2018), Depression, GERD (gastroesophageal reflux disease), Sleep apnea, and Substance abuse ([place]). Surgical: Mr. Lafavor  has a past surgical history that includes Carpal tunnel release (Left, 2014) and Back surgery (01/05/2017). Family: family history includes Kidney disease in his brother.  Constitutional Exam  General appearance: Well nourished, well developed, and well hydrated. In no apparent acute distress Vitals:   11/05/18 1048  BP: (!) 152/66  Pulse: 78  Resp: 18  Temp: 98.2 F (36.8 C)  TempSrc: Oral  SpO2: 98%  Weight: 183 lb (83 kg)  Height: '5\' 8"'  (1.727 m)   BMI Assessment: Estimated body mass index is 27.83 kg/m as calculated from the following:   Height as of this encounter: '5\' 8"'  (1.727 m).   Weight as of this encounter: 183 lb (83 kg).  BMI interpretation table: BMI level Category Range association with higher incidence of chronic pain  <18 kg/m2 Underweight   18.5-24.9 kg/m2 Ideal body weight   25-29.9 kg/m2 Overweight Increased incidence by 20%  30-34.9 kg/m2 Obese (Class I) Increased incidence by 68%  35-39.9 kg/m2 Severe obesity (Class II) Increased incidence by 136%  >40 kg/m2 Extreme obesity (Class III) Increased incidence by 254%   Patient's current BMI Ideal Body weight  Body mass index  is 27.83 kg/m. Ideal body weight: 68.4 kg (150 lb 12.7 oz) Adjusted ideal body weight: 74.2 kg (163 lb 10.8 oz)   BMI Readings from Last 4 Encounters:  11/05/18 27.83 kg/m  10/03/18 27.37 kg/m   Wt Readings from Last 4 Encounters:  11/05/18 183 lb (83 kg)  10/03/18 180 lb (81.6 kg)  Psych/Mental status: Alert, oriented x 3 (person, place, & time)       Eyes: PERLA Respiratory: No evidence of acute respiratory  distress  Cervical Spine Area Exam  Skin & Axial Inspection: No masses, redness, edema, swelling, or associated skin lesions Alignment: Symmetrical Functional ROM: Unrestricted ROM      Stability: No instability detected Muscle Tone/Strength: Functionally intact. No obvious neuro-muscular anomalies detected. Sensory (Neurological): Unimpaired Palpation: No palpable anomalies              Upper Extremity (UE) Exam    Side: Right upper extremity  Side: Left upper extremity  Skin & Extremity Inspection: Skin color, temperature, and hair growth are WNL. No peripheral edema or cyanosis. No masses, redness, swelling, asymmetry, or associated skin lesions. No contractures.  Skin & Extremity Inspection: Skin color, temperature, and hair growth are WNL. No peripheral edema or cyanosis. No masses, redness, swelling, asymmetry, or associated skin lesions. No contractures.  Functional ROM: Unrestricted ROM          Functional ROM: Unrestricted ROM          Muscle Tone/Strength: Functionally intact. No obvious neuro-muscular anomalies detected.  Muscle Tone/Strength: Functionally intact. No obvious neuro-muscular anomalies detected.  Sensory (Neurological): Unimpaired          Sensory (Neurological): Unimpaired          Palpation: No palpable anomalies              Palpation: No palpable anomalies              Provocative Test(s):  Phalen's test: deferred Tinel's test: deferred Apley's scratch test (touch opposite shoulder):  Action 1 (Across chest): deferred Action 2 (Overhead):  deferred Action 3 (LB reach): deferred   Provocative Test(s):  Phalen's test: deferred Tinel's test: deferred Apley's scratch test (touch opposite shoulder):  Action 1 (Across chest): deferred Action 2 (Overhead): deferred Action 3 (LB reach): deferred    Thoracic Spine Area Exam  Skin & Axial Inspection: No masses, redness, or swelling Alignment: Symmetrical Functional ROM: Unrestricted ROM Stability: No instability detected Muscle Tone/Strength: Functionally intact. No obvious neuro-muscular anomalies detected. Sensory (Neurological): Unimpaired Muscle strength & Tone: No palpable anomalies  Lumbar Spine Area Exam  Skin & Axial Inspection: No masses, redness, or swelling Alignment: Symmetrical Functional ROM: Unrestricted ROM       Stability: No instability detected Muscle Tone/Strength: Functionally intact. No obvious neuro-muscular anomalies detected. Sensory (Neurological): Unimpaired Palpation: No palpable anomalies       Provocative Tests: Hyperextension/rotation test: deferred today       Lumbar quadrant test (Kemp's test): deferred today       Lateral bending test: deferred today       Patrick's Maneuver: deferred today                   FABER* test: deferred today                   S-I anterior distraction/compression test: deferred today         S-I lateral compression test: deferred today         S-I Thigh-thrust test: deferred today         S-I Gaenslen's test: deferred today         *(Flexion, ABduction and External Rotation)  Gait & Posture Assessment  Ambulation: Patient ambulates using a cane Gait: Limited. Using assistive device to ambulate Posture: Difficulty standing up straight, due to pain   Lower Extremity Exam    Side: Right lower extremity  Side: Left lower extremity  Stability: No instability observed  Stability: No instability observed          Skin & Extremity Inspection: Skin color, temperature, and hair growth are WNL. No  peripheral edema or cyanosis. No masses, redness, swelling, asymmetry, or associated skin lesions. No contractures.  Skin & Extremity Inspection: Skin color, temperature, and hair growth are WNL. No peripheral edema or cyanosis. No masses, redness, swelling, asymmetry, or associated skin lesions. No contractures.  Functional ROM: Decreased ROM for hip joint          Functional ROM: Decreased ROM for hip joint          Muscle Tone/Strength: Functionally intact. No obvious neuro-muscular anomalies detected.  Muscle Tone/Strength: Functionally intact. No obvious neuro-muscular anomalies detected.  Sensory (Neurological): Unimpaired        Sensory (Neurological): Unimpaired        DTR: Patellar: deferred today Achilles: deferred today Plantar: deferred today  DTR: Patellar: deferred today Achilles: deferred today Plantar: deferred today  Palpation: No palpable anomalies  Palpation: No palpable anomalies   Assessment & Plan  Primary Diagnosis & Pertinent Problem List: The primary encounter diagnosis was Chronic pain syndrome. 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, Lumbar lateral recess stenosis (Bilateral: L2-3, L3-4, L5-S1) (Right: T11-12) (Left: L4-5), Lumbar foraminal stenosis (Bilateral: L3-4, L5-S1) (Right: T11-12) (Left: L4-5), Chronic hip pain (Bilateral) (R>L), Osteoarthritis of hips (Bilateral), Abnormal MRI, lumbar spine (07/14/2017), Chronic shoulder pain (Tertiary Area of Pain) (Right), Chronic wrist pain (Left), Ganglion cyst of wrist (Left), 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  Visit Diagnosis: 1. Chronic pain syndrome   2. Chronic low back pain (Primary Area of Pain) (Bilateral) (R>L) w/ sciatica (Bilateral)   3. DDD (degenerative disc disease), lumbar   4. Lumbar facet hypertrophy (Bilateral: L2-3, L3-4, L5-S1) (Left: L4-5) (Right: L4-5 facetectomy)   5. Lumbar facet syndrome (Bilateral) (R>L)   6. Failed back surgical syndrome   7. Chronic lower extremity pain (Secondary Area of Pain) (Bilateral) (R>L)   8. 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 Lumbar lateral recess stenosis (Bilateral: L2-3, L3-4, L5-S1) (Right: T11-12) (Left: L4-5)   10. Lumbar foraminal stenosis (Bilateral: L3-4, L5-S1) (Right: T11-12) (Left: L4-5)   11. Chronic hip pain (Bilateral) (R>L)   12. Osteoarthritis of hips (Bilateral)   13. Abnormal MRI, lumbar spine (07/14/2017)   14. Chronic shoulder pain (Tertiary Area of Pain) (Right)   15. Chronic wrist pain (Left)   16. Ganglion cyst of wrist (Left)   17. 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 Chronic musculoskeletal pain   19. Neurogenic pain   20. Disorder of skeletal system   21. Pharmacologic therapy   22. Problems influencing health status   23. OSA on CPAP    Problems updated and reviewed during this visit: Problem  Ddd (Degenerative Disc Disease), Lumbar  Lumbar facet syndrome (Bilateral) (R>L)  Chronic hip pain (Bilateral) (R>L)  Chronic Musculoskeletal Pain  Neurogenic Pain  Osteoarthritis of hips (Bilateral)    Plan of Care  Pharmacotherapy (Medications Ordered): Meds ordered this encounter  Medications  . baclofen (LIORESAL) 10 MG tablet    Sig: Take 1 tablet (10 mg total) by mouth 4 (four) times daily.    Dispense:  120 tablet    Refill:  2    Do not place medication on  "Automatic Refill". Fill one day early if pharmacy is closed on scheduled refill date.  . gabapentin (NEURONTIN) 300 MG capsule    Sig: Take 1 capsule (300 mg total) by mouth 4 (four) times daily.    Dispense:  120 capsule    Refill:  2    Do not place medication on "Automatic Refill". Fill one day early if pharmacy is closed on scheduled refill date.  . meloxicam (MOBIC) 15 MG tablet    Sig: Take 1 tablet (15 mg total) by mouth daily.    Dispense:  30 tablet    Refill:  2    Do not place medication on "Automatic Refill". Fill one day early if pharmacy is closed on scheduled refill date.   Procedure Orders    No procedure(s) ordered today   Lab Orders  No laboratory test(s) ordered today    Imaging Orders     DG Lumbar Spine Complete W/Bend     DG HIP UNILAT W OR W/O PELVIS 2-3 VIEWS RIGHT     DG HIP UNILAT W OR W/O PELVIS 2-3 VIEWS LEFT Referral Orders  No referral(s) requested today    Pharmacological management options:  Opioid Analgesics: I will not be prescribing any opioids at this time Membrane stabilizer: The patient is currently taking gabapentin 300 mg 3 times daily which we will increase to 4 times daily.  Today I will be taking over this medication. Muscle relaxant: The patient is currently taking baclofen 10 mg p.o. 3 times daily which I will increase to 4 times daily.  Today I will be taking over this medication. NSAID: The patient is currently taking meloxicam 15 mg p.o. daily which I will be taking over today. Other analgesic(s): I will not be taking over the Cymbalta or any of the other medications as they are prescribed by his psychiatrist.  I agree with keeping him on the Cymbalta for the purpose of treatment depression as well as treatment of pain.  However, close time should be kept on the patient since he is in more than one antidepressant and therefore he is at risk of developing serotonin syndrome.  Today there were no signs or symptoms of a serotonin crisis.    Interventional management options: Planned, scheduled, and/or pending:    Diagnostic bilateral lumbar facet block #1 under fluoroscopic guidance and IV sedation   Considering:   Diagnostic bilateral lumbar facet block  Possible bilateral lumbar facet RFA  Diagnostic midline caudal ESI + diagnostic epidurogram  Possible Racz procedure  Diagnostic right-sided L3-4 LESI  Diagnostic right-sided L4-5 LESI  Diagnostic right-sided L5-S1 LESI  Diagnostic right-sided T11 transforaminal TESI Diagnostic bilateral L2 transforaminal LESI  Diagnostic bilateral L3 transforaminal LESI  Diagnostic left-sided L4 transforaminal LESI  Diagnostic bilateral L5 transforaminal LESI  Diagnostic right-sided intra-articular shoulder joint injection  Diagnostic right suprascapular nerve block  Possible right-sided suprascapular nerve RFA  Diagnostic right wrist injection  Diagnostic right sacroiliac joint injection  Possible right sacroiliac joint RFA    PRN Procedures:   None at this time   Provider-requested follow-up: Return for Procedure (w/ sedation): (B) L-FCT BLK #1.  No future appointments.  Primary Care Physician: [facility]  Medical Location: [facility] Note by: Gaspar Cola, MD Date: 11/05/2018; Time: 12:19 PM

## 2018-11-05 NOTE — Patient Instructions (Addendum)
____________________________________________________________________________________________  Preparing for Procedure with Sedation  Instructions: . Oral Intake: Do not eat or drink anything for at least 8 hours prior to your procedure. . Transportation: Public transportation is not allowed. Bring an adult driver. The driver must be physically present in our waiting room before any procedure can be started. . Physical Assistance: Bring an adult physically capable of assisting you, in the event you need help. This adult should keep you company at home for at least 6 hours after the procedure. . Blood Pressure Medicine: Take your blood pressure medicine with a sip of water the morning of the procedure. . Blood thinners: Notify our staff if you are taking any blood thinners. Depending on which one you take, there will be specific instructions on how and when to stop it. . Diabetics on insulin: Notify the staff so that you can be scheduled 1st case in the morning. If your diabetes requires high dose insulin, take only  of your normal insulin dose the morning of the procedure and notify the staff that you have done so. . Preventing infections: Shower with an antibacterial soap the morning of your procedure. . Build-up your immune system: Take 1000 mg of Vitamin C with every meal (3 times a day) the day prior to your procedure. . Antibiotics: Inform the staff if you have a condition or reason that requires you to take antibiotics before dental procedures. . Pregnancy: If you are pregnant, call and cancel the procedure. . Sickness: If you have a cold, fever, or any active infections, call and cancel the procedure. . Arrival: You must be in the facility at least 30 minutes prior to your scheduled procedure. . Children: Do not bring children with you. . Dress appropriately: Bring dark clothing that you would not mind if they get stained. . Valuables: Do not bring any jewelry or valuables.  Procedure  appointments are reserved for interventional treatments only. . No Prescription Refills. . No medication changes will be discussed during procedure appointments. . No disability issues will be discussed.  Reasons to call and reschedule or cancel your procedure: (Following these recommendations will minimize the risk of a serious complication.) . Surgeries: Avoid having procedures within 2 weeks of any surgery. (Avoid for 2 weeks before or after any surgery). . Flu Shots: Avoid having procedures within 2 weeks of a flu shots or . (Avoid for 2 weeks before or after immunizations). . Barium: Avoid having a procedure within 7-10 days after having had a radiological study involving the use of radiological contrast. (Myelograms, Barium swallow or enema study). . Heart attacks: Avoid any elective procedures or surgeries for the initial 6 months after a "Myocardial Infarction" (Heart Attack). . Blood thinners: It is imperative that you stop these medications before procedures. Let us know if you if you take any blood thinner.  . Infection: Avoid procedures during or within two weeks of an infection (including chest colds or gastrointestinal problems). Symptoms associated with infections include: Localized redness, fever, chills, night sweats or profuse sweating, burning sensation when voiding, cough, congestion, stuffiness, runny nose, sore throat, diarrhea, nausea, vomiting, cold or Flu symptoms, recent or current infections. It is specially important if the infection is over the area that we intend to treat. . Heart and lung problems: Symptoms that may suggest an active cardiopulmonary problem include: cough, chest pain, breathing difficulties or shortness of breath, dizziness, ankle swelling, uncontrolled high or unusually low blood pressure, and/or palpitations. If you are experiencing any of these symptoms, cancel   your procedure and contact your primary care physician for an evaluation.  Remember:   Regular Business hours are:  Monday to Thursday 8:00 AM to 4:00 PM  Provider's Schedule: Delano Metz, MD:  Procedure days: Tuesday and Thursday 7:30 AM to 4:00 PM  Edward Jolly, MD:  Procedure days: Monday and Wednesday 7:30 AM to 4:00 PM ____________________________________________________________________________________________   ____________________________________________________________________________________________  Medication Rules  Purpose: To inform patients, and their family members, of our rules and regulations.  Applies to: All patients receiving prescriptions (written or electronic).  Pharmacy of record: Pharmacy where electronic prescriptions will be sent. If written prescriptions are taken to a different pharmacy, please inform the nursing staff. The pharmacy listed in the electronic medical record should be the one where you would like electronic prescriptions to be sent.  Electronic prescriptions: In compliance with the [facility] Strengthen Opioid Misuse Prevention (STOP) Act of 2017 (Session Conni Elliot [phone]), effective September 26, 2018, all controlled substances must be electronically prescribed. Calling prescriptions to the pharmacy will cease to exist.  Prescription refills: Only during scheduled appointments. Applies to all prescriptions.  NOTE: The following applies primarily to controlled substances (Opioid* Pain Medications).   Patient's responsibilities: 1. Pain Pills: Bring all pain pills to every appointment (except for procedure appointments). 2. Pill Bottles: Bring pills in original pharmacy bottle. Always bring the newest bottle. Bring bottle, even if empty. 3. Medication refills: You are responsible for knowing and keeping track of what medications you take and those you need refilled. The day before your appointment: write a list of all prescriptions that need to be refilled. The day of the appointment: give the list to the admitting  nurse. Prescriptions will be written only during appointments. No prescriptions will be written on procedure days. If you forget a medication: it will not be "Called in", "Faxed", or "electronically sent". You will need to get another appointment to get these prescribed. No early refills. Do not call asking to have your prescription filled early. 4. Prescription Accuracy: You are responsible for carefully inspecting your prescriptions before leaving our office. Have the discharge nurse carefully go over each prescription with you, before taking them home. Make sure that your name is accurately spelled, that your address is correct. Check the name and dose of your medication to make sure it is accurate. Check the number of pills, and the written instructions to make sure they are clear and accurate. Make sure that you are given enough medication to last until your next medication refill appointment. 5. Taking Medication: Take medication as prescribed. When it comes to controlled substances, taking less pills or less frequently than prescribed is permitted and encouraged. Never take more pills than instructed. Never take medication more frequently than prescribed.  6. Inform other Doctors: Always inform, all of your healthcare providers, of all the medications you take. 7. Pain Medication from other Providers: You are not allowed to accept any additional pain medication from any other Doctor or Healthcare provider. There are two exceptions to this rule. (see below) In the event that you require additional pain medication, you are responsible for notifying us, as stated below. 8. Medication Agreement: You are responsible for carefully reading and following our Medication Agreement. This must be signed before receiving any prescriptions from our practice. Safely store a copy of your signed Agreement. Violations to the Agreement will result in no further prescriptions. (Additional copies of our Medication  Agreement are available upon request.) 9. Laws, Rules, & Regulations: All patients are expected  to follow all [address] and [employer], Rules, & Regulations. Ignorance of the Laws does not constitute a valid excuse. The use of any illegal substances is prohibited. 10. Adopted CDC guidelines & recommendations: Target dosing levels will be at or below 60 MME/day. Use of benzodiazepines** is not recommended.  Exceptions: There are only two exceptions to the rule of not receiving pain medications from other Healthcare Providers. 1. Exception #1 (Emergencies): In the event of an emergency (i.e.: accident requiring emergency care), you are allowed to receive additional pain medication. However, you are responsible for: As soon as you are able, call our office [phone], at any time of the day or night, and leave a message stating your name, the date and nature of the emergency, and the name and dose of the medication prescribed. In the event that your call is answered by a member of our staff, make sure to document and save the date, time, and the name of the person that took your information.  2. Exception #2 (Planned Surgery): In the event that you are scheduled by another doctor or dentist to have any type of surgery or procedure, you are allowed (for a period no longer than 30 days), to receive additional pain medication, for the acute post-op pain. However, in this case, you are responsible for picking up a copy of our "Post-op Pain Management for Surgeons" handout, and giving it to your surgeon or dentist. This document is available at our office, and does not require an appointment to obtain it. Simply go to our office during business hours (Monday-Thursday from 8:00 AM to 4:00 PM) (Friday 8:00 AM to 12:00 Noon) or if you have a scheduled appointment with [place], prior to your surgery, and ask for it by name. In addition, you will need to provide [place] with your name, name of your surgeon, type of  surgery, and date of procedure or surgery.  *Opioid medications include: morphine, codeine, oxycodone, oxymorphone, hydrocodone, hydromorphone, meperidine, tramadol, tapentadol, buprenorphine, fentanyl, methadone. **Benzodiazepine medications include: diazepam (Valium), alprazolam (Xanax), clonazepam (Klonopine), lorazepam (Ativan), clorazepate (Tranxene), chlordiazepoxide (Librium), estazolam (Prosom), oxazepam (Serax), temazepam (Restoril), triazolam (Halcion) (Last updated: 11/23/2017) ____________________________________________________________________________________________   ____________________________________________________________________________________________  Medication Recommendations and Reminders  Applies to: All patients receiving prescriptions (written and/or electronic).  Medication Rules & Regulations: These rules and regulations exist for your safety and that of others. They are not flexible and neither are we. Dismissing or ignoring them will be considered "non-compliance" with medication therapy, resulting in complete and irreversible termination of such therapy. (See document titled "Medication Rules" for more details.) In all conscience, because of safety reasons, we cannot continue providing a therapy where the patient does not follow instructions.  Pharmacy of record:   Definition: This is the pharmacy where your electronic prescriptions will be sent.   We do not endorse any particular pharmacy.  You are not restricted in your choice of pharmacy.  The pharmacy listed in the electronic medical record should be the one where you want electronic prescriptions to be sent.  If you choose to change pharmacy, simply notify our nursing staff of your choice of new pharmacy.  Recommendations:  Keep all of your pain medications in a safe place, under lock and key, even if you live alone.   After you fill your prescription, take 1 week's worth of pills and put them  away in a safe place. You should keep a separate, properly labeled bottle for this purpose. The remainder should be kept in  the original bottle. Use this as your primary supply, until it runs out. Once it's gone, then you know that you have 1 week's worth of medicine, and it is time to come in for a prescription refill. If you do this correctly, it is unlikely that you will ever run out of medicine.  To make sure that the above recommendation works, it is very important that you make sure your medication refill appointments are scheduled at least 1 week before you run out of medicine. To do this in an effective manner, make sure that you do not leave the office without scheduling your next medication management appointment. Always ask the nursing staff to show you in your prescription , when your medication will be running out. Then arrange for the receptionist to get you a return appointment, at least 7 days before you run out of medicine. Do not wait until you have 1 or 2 pills left, to come in. This is very poor planning and does not take into consideration that we may need to cancel appointments due to bad weather, sickness, or emergencies affecting our staff.  "Partial Fill": If for any reason your pharmacy does not have enough pills/tablets to completely fill or refill your prescription, do not allow for a "partial fill". You will need a separate prescription to fill the remaining amount, which we will not provide. If the reason for the partial fill is your insurance, you will need to talk to the pharmacist about payment alternatives for the remaining tablets, but again, do not accept a partial fill.  Prescription refills and/or changes in medication(s):   Prescription refills, and/or changes in dose or medication, will be conducted only during scheduled medication management appointments. (Applies to both, written and electronic prescriptions.)  No refills on procedure days. No medication will be  changed or started on procedure days. No changes, adjustments, and/or refills will be conducted on a procedure day. Doing so will interfere with the diagnostic portion of the procedure.  No phone refills. No medications will be "called into the pharmacy".  No Fax refills.  No weekend refills.  No Holliday refills.  No after hours refills.  Remember:  Business hours are:  Monday to Thursday 8:00 AM to 4:00 PM Provider's Schedule: Thad Rangerrystal King, NP - Appointments are:  Medication management: Monday to Thursday 8:00 AM to 4:00 PM Delano MetzFrancisco Charleigh Correnti, MD - Appointments are:  Medication management: Monday and Wednesday 8:00 AM to 4:00 PM Procedure day: Tuesday and Thursday 7:30 AM to 4:00 PM Edward JollyBilal Lateef, MD - Appointments are:  Medication management: Tuesday and Thursday 8:00 AM to 4:00 PM Procedure day: Monday and Wednesday 7:30 AM to 4:00 PM (Last update: 11/23/2017) ____________________________________________________________________________________________   ____________________________________________________________________________________________  CANNABIDIOL (AKA: CBD Oil or Pills)  Applies to: All patients receiving prescriptions of controlled substances (written and/or electronic).  General Information: Cannabidiol (CBD) was discovered in 201940. It is one of some 113 identified cannabinoids in cannabis (Marijuana) plants, accounting for up to 40% of the plant's extract. As of 2018, preliminary clinical research on cannabidiol included studies of anxiety, cognition, movement disorders, and pain.  Cannabidiol is consummed in multiple ways, including inhalation of cannabis smoke or vapor, as an aerosol spray into the cheek, and by mouth. It may be supplied as CBD oil containing CBD as the active ingredient (no added tetrahydrocannabinol (THC) or terpenes), a full-plant CBD-dominant hemp extract oil, capsules, dried cannabis, or as a liquid solution. CBD is thought not have the same  psychoactivity as  THC, and may affect the actions of THC. Studies suggest that CBD may interact with different biological targets, including cannabinoid receptors and other neurotransmitter receptors. As of 2018 the mechanism of action for its biological effects has not been determined.  In the [place], cannabidiol has a limited approval by the Food and Drug Administration (FDA) for treatment of only two types of epilepsy disorders. The side effects of long-term use of the drug include somnolence, decreased appetite, diarrhea, fatigue, malaise, weakness, sleeping problems, and others.  CBD remains a Schedule I drug prohibited for any use.  Legality: Some manufacturers ship CBD products nationally, an illegal action which the FDA has not enforced in 2018, with CBD remaining the subject of an FDA investigational new drug evaluation, and is not considered legal as a dietary supplement or food ingredient as of December 2018. Federal illegality has made it difficult historically to conduct research on CBD. CBD is openly sold in head shops and health food stores in some states where such sales have not been explicitly legalized.  Warning: Because it is not FDA approved for general use or treatment of pain, it is not required to undergo the same manufacturing controls as prescription drugs.  This means that the available cannabidiol (CBD) may be contaminated with THC.  If this is the case, it will trigger a positive urine drug screen (UDS) test for cannabinoids (Marijuana).  Because a positive UDS for illicit substances is a violation of our medication agreement, your opioid analgesics (pain medicine) may be permanently discontinued. (Last update: 12/14/2017) ____________________________________________________________________________________________    ______________________________________________________________________________________________  Specialty Pain Scale  Introduction:  There are  significant differences in how pain is reported. The word pain usually refers to physical pain, but it is also a common synonym of suffering. The medical community uses a scale from 0 (zero) to 10 (ten) to report pain level. Zero (0) is described as "no pain", while ten (10) is described as "the worse pain you can imagine". The problem with this scale is that physical pain is reported along with suffering. Suffering refers to mental pain, or more often yet it refers to any unpleasant feeling, emotion or aversion associated with the perception of harm or threat of harm. It is the psychological component of pain.  Pain Specialists prefer to separate the two components. The pain scale used by this practice is the Verbal Numerical Rating Scale (VNRS-11). This scale is for the physical pain only. DO NOT INCLUDE how your pain psychologically affects you. This scale is for adults 84 years of age and older. It has 11 (eleven) levels. The 1st level is 0/10. This means: "right now, I have no pain". In the context of pain management, it also means: "right now, my physical pain is under control with the current therapy".  General Information:  The scale should reflect your current level of pain. Unless you are specifically asked for the level of your worst pain, or your average pain. If you are asked for one of these two, then it should be understood that it is over the past 24 hours.  Levels 1 (one) through 5 (five) are described below, and can be treated as an outpatient. Ambulatory pain management facilities such as ours are more than adequate to treat these levels. Levels 6 (six) through 10 (ten) are also described below, however, these must be treated as a hospitalized patient. While levels 6 (six) and 7 (seven) may be evaluated at an urgent care facility, levels 8 (eight) through  10 (ten) constitute medical emergencies and as such, they belong in a hospital's emergency department. When having these levels (as  described below), do not come to our office. Our facility is not equipped to manage these levels. Go directly to an urgent care facility or an emergency department to be evaluated.  Definitions:  Activities of Daily Living (ADL): Activities of daily living (ADL or ADLs) is a term used in healthcare to refer to people's daily self-care activities. Health professionals often use a person's ability or inability to perform ADLs as a measurement of their functional status, particularly in regard to people post injury, with disabilities and the elderly. There are two ADL levels: Basic and Instrumental. Basic Activities of Daily Living (BADL  or BADLs) consist of self-care tasks that include: Bathing and showering; personal hygiene and grooming (including brushing/combing/styling hair); dressing; Toilet hygiene (getting to the toilet, cleaning oneself, and getting back up); eating and self-feeding (not including cooking or chewing and swallowing); functional mobility, often referred to as "transferring", as measured by the ability to walk, get in and out of bed, and get into and out of a chair; the broader definition (moving from one place to another while performing activities) is useful for people with different physical abilities who are still able to get around independently. Basic ADLs include the things many people do when they get up in the morning and get ready to go out of the house: get out of bed, go to the toilet, bathe, dress, groom, and eat. On the average, loss of function typically follows a particular order. Hygiene is the first to go, followed by loss of toilet use and locomotion. The last to go is the ability to eat. When there is only one remaining area in which the person is independent, there is a 62.9% chance that it is eating and only a 3.5% chance that it is hygiene. Instrumental Activities of Daily Living (IADL or IADLs) are not necessary for fundamental functioning, but they let an  individual live independently in a community. IADL consist of tasks that include: cleaning and maintaining the house; home establishment and maintenance; care of others (including selecting and supervising caregivers); care of pets; child rearing; managing money; managing financials (investments, etc.); meal preparation and cleanup; shopping for groceries and necessities; moving within the community; safety procedures and emergency responses; health management and maintenance (taking prescribed medications); and using the telephone or other form of communication.  Instructions:  Most patients tend to report their pain as a combination of two factors, their physical pain and their psychosocial pain. This last one is also known as "suffering" and it is reflection of how physical pain affects you socially and psychologically. From now on, report them separately.  From this point on, when asked to report your pain level, report only your physical pain. Use the following table for reference.  Pain Clinic Pain Levels (0-5/10)  Pain Level Score  Description  No Pain 0   Mild pain 1 Nagging, annoying, but does not interfere with basic activities of daily living (ADL). Patients are able to eat, bathe, get dressed, toileting (being able to get on and off the toilet and perform personal hygiene functions), transfer (move in and out of bed or a chair without assistance), and maintain continence (able to control bladder and bowel functions). Blood pressure and heart rate are unaffected. A normal heart rate for a healthy adult ranges from 60 to 100 bpm (beats per minute).   Mild to moderate pain  2 Noticeable and distracting. Impossible to hide from other people. More frequent flare-ups. Still possible to adapt and function close to normal. It can be very annoying and may have occasional stronger flare-ups. With discipline, patients may get used to it and adapt.   Moderate pain 3 Interferes significantly with  activities of daily living (ADL). It becomes difficult to feed, bathe, get dressed, get on and off the toilet or to perform personal hygiene functions. Difficult to get in and out of bed or a chair without assistance. Very distracting. With effort, it can be ignored when deeply involved in activities.   Moderately severe pain 4 Impossible to ignore for more than a few minutes. With effort, patients may still be able to manage work or participate in some social activities. Very difficult to concentrate. Signs of autonomic nervous system discharge are evident: dilated pupils (mydriasis); mild sweating (diaphoresis); sleep interference. Heart rate becomes elevated (>115 bpm). Diastolic blood pressure (lower number) rises above 100 mmHg. Patients find relief in laying down and not moving.   Severe pain 5 Intense and extremely unpleasant. Associated with frowning face and frequent crying. Pain overwhelms the senses.  Ability to do any activity or maintain social relationships becomes significantly limited. Conversation becomes difficult. Pacing back and forth is common, as getting into a comfortable position is nearly impossible. Pain wakes you up from deep sleep. Physical signs will be obvious: pupillary dilation; increased sweating; goosebumps; brisk reflexes; cold, clammy hands and feet; nausea, vomiting or dry heaves; loss of appetite; significant sleep disturbance with inability to fall asleep or to remain asleep. When persistent, significant weight loss is observed due to the complete loss of appetite and sleep deprivation.  Blood pressure and heart rate becomes significantly elevated. Caution: If elevated blood pressure triggers a pounding headache associated with blurred vision, then the patient should immediately seek attention at an urgent or emergency care unit, as these may be signs of an impending stroke.    Emergency Department Pain Levels (6-10/10)  Emergency Room Pain 6 Severely limiting.  Requires emergency care and should not be seen or managed at an outpatient pain management facility. Communication becomes difficult and requires great effort. Assistance to reach the emergency department may be required. Facial flushing and profuse sweating along with potentially dangerous increases in heart rate and blood pressure will be evident.   Distressing pain 7 Self-care is very difficult. Assistance is required to transport, or use restroom. Assistance to reach the emergency department will be required. Tasks requiring coordination, such as bathing and getting dressed become very difficult.   Disabling pain 8 Self-care is no longer possible. At this level, pain is disabling. The individual is unable to do even the most "basic" activities such as walking, eating, bathing, dressing, transferring to a bed, or toileting. Fine motor skills are lost. It is difficult to think clearly.   Incapacitating pain 9 Pain becomes incapacitating. Thought processing is no longer possible. Difficult to remember your own name. Control of movement and coordination are lost.   The worst pain imaginable 10 At this level, most patients pass out from pain. When this level is reached, collapse of the autonomic nervous system occurs, leading to a sudden drop in blood pressure and heart rate. This in turn results in a temporary and dramatic drop in blood flow to the brain, leading to a loss of consciousness. Fainting is one of the body's self defense mechanisms. Passing out puts the brain in a calmed state and causes it to  shut down for a while, in order to begin the healing process.    Summary: 1. Refer to this scale when providing [place] with your pain level. 2. Be accurate and careful when reporting your pain level. This will help with your care. 3. Over-reporting your pain level will lead to loss of credibility. 4. Even a level of 1/10 means that there is pain and will be treated at our facility. 5. High, inaccurate  reporting will be documented as "Symptom Exaggeration", leading to loss of credibility and suspicions of possible secondary gains such as obtaining more narcotics, or wanting to appear disabled, for fraudulent reasons. 6. Only pain levels of 5 or below will be seen at our facility. 7. Pain levels of 6 and above will be sent to the Emergency Department and the appointment cancelled. ______________________________________________________________________________________________

## 2018-11-15 ENCOUNTER — Ambulatory Visit: Payer: Non-veteran care | Admitting: Pain Medicine

## 2018-11-15 DIAGNOSIS — M47817 Spondylosis without myelopathy or radiculopathy, lumbosacral region: Secondary | ICD-10-CM | POA: Insufficient documentation

## 2018-11-15 NOTE — Progress Notes (Deleted)
Patient's Name: Nicolas Colon  MRN: [id number]  Referring Provider: [facility]  DOB: [birth year]  PCP: [facility]  DOS: 11/15/2018  Note by: Oswaldo Done, MD  Service setting: Ambulatory outpatient  Specialty: Interventional Pain Management  Patient type: Established  Location: [facility] (AMB) Pain Management Facility  Visit type: Interventional Procedure   Primary Reason for Visit: Interventional Pain Management Treatment. CC: No chief complaint on file.  Procedure:          Anesthesia, Analgesia, Anxiolysis:  Type: Lumbar Facet, Medial Branch Block(s) #1  Primary Purpose: Diagnostic Region: Posterolateral Lumbosacral Spine Level: L2, L3, L4, L5, & S1 Medial Branch Level(s). Injecting these levels blocks the L3-4, L4-5, and L5-S1 lumbar facet joints. Laterality: Bilateral  Type: Moderate (Conscious) Sedation combined with Local Anesthesia Indication(s): Analgesia and Anxiety Route: Intravenous (IV) IV Access: Secured Sedation: Meaningful verbal contact was maintained at all times during the procedure  Local Anesthetic: Lidocaine 1-2%  Position: Prone   Indications: 1. Spondylosis without myelopathy or radiculopathy, lumbosacral region   2. Lumbar facet syndrome (Bilateral) (R>L)   3. Lumbar facet hypertrophy (Bilateral: L2-3, L3-4, L5-S1) (Left: L4-5) (Right: L4-5 facetectomy)   4. DDD (degenerative disc disease), lumbar   5. Chronic low back pain (Primary Area of Pain) (Bilateral) (R>L) w/o sciatica    Pain Score: Pre-procedure:  /10 Post-procedure:  /10  Pre-op Assessment:  Nicolas Colon is a 58 y.o. (year old), male patient, seen today for interventional treatment. He  has a past surgical history that includes Carpal tunnel release (Left, 2014) and Back surgery (01/05/2017). Nicolas Colon has a current medication list which includes the following prescription(s): aspirin, augmented betamethasone dipropionate, baclofen, budesonide-formoterol, bupropion,  carboxymethylcellulose, divalproex, duloxetine, flunisolide, gabapentin, hydroxyzine, ipratropium, ipratropium-albuterol, ketotifen, lisinopril, meloxicam, montelukast, multiple vitamins-minerals, omeprazole, prazosin, quetiapine, ranitidine, sildenafil, sodium chloride, terazosin, trazodone, and vitamin d (cholecalciferol). His primarily concern today is the No chief complaint on file.  Initial Vital Signs:  Pulse/HCG Rate:    Temp:   Resp:   BP:   SpO2:    BMI: Estimated body mass index is 27.83 kg/m as calculated from the following:   Height as of 11/05/18: 5\' 8"  (1.727 m).   Weight as of 11/05/18: 183 lb (83 kg).  Risk Assessment: Allergies: Reviewed. He is allergic to aspirin; grass extracts [gramineae pollens]; hydrocodone; keflex [cephalexin]; and niacin and related.  Allergy Precautions: None required Coagulopathies: Reviewed. None identified.  Blood-thinner therapy: None at this time Active Infection(s): Reviewed. None identified. Nicolas Colon is afebrile  Site Confirmation: Nicolas Colon was asked to confirm the procedure and laterality before marking the site Procedure checklist: Completed Consent: Before the procedure and under the influence of no sedative(s), amnesic(s), or anxiolytics, the patient was informed of the treatment options, risks and possible complications. To fulfill our ethical and legal obligations, as recommended by the American Medical Association's Code of Ethics, I have informed the patient of my clinical impression; the nature and purpose of the treatment or procedure; the risks, benefits, and possible complications of the intervention; the alternatives, including doing nothing; the risk(s) and benefit(s) of the alternative treatment(s) or procedure(s); and the risk(s) and benefit(s) of doing nothing. The patient was provided information about the general risks and possible complications associated with the procedure. These may include, but are not limited to: failure  to achieve desired goals, infection, bleeding, organ or nerve damage, allergic reactions, paralysis, and death. In addition, the patient was informed of those risks and complications associated to Spine-related procedures,  such as failure to decrease pain; infection (i.e.: Meningitis, epidural or intraspinal abscess); bleeding (i.e.: epidural hematoma, subarachnoid hemorrhage, or any other type of intraspinal or peri-dural bleeding); organ or nerve damage (i.e.: Any type of peripheral nerve, nerve root, or spinal cord injury) with subsequent damage to sensory, motor, and/or autonomic systems, resulting in permanent pain, numbness, and/or weakness of one or several areas of the body; allergic reactions; (i.e.: anaphylactic reaction); and/or death. Furthermore, the patient was informed of those risks and complications associated with the medications. These include, but are not limited to: allergic reactions (i.e.: anaphylactic or anaphylactoid reaction(s)); adrenal axis suppression; blood sugar elevation that in diabetics may result in ketoacidosis or comma; water retention that in patients with history of congestive heart failure may result in shortness of breath, pulmonary edema, and decompensation with resultant heart failure; weight gain; swelling or edema; medication-induced neural toxicity; particulate matter embolism and blood vessel occlusion with resultant organ, and/or nervous system infarction; and/or aseptic necrosis of one or more joints. Finally, the patient was informed that Medicine is not an exact science; therefore, there is also the possibility of unforeseen or unpredictable risks and/or possible complications that may result in a catastrophic outcome. The patient indicated having understood very clearly. We have given the patient no guarantees and we have made no promises. Enough time was given to the patient to ask questions, all of which were answered to the patient's satisfaction. Nicolas Colon  has indicated that he wanted to continue with the procedure. Attestation: I, the ordering provider, attest that I have discussed with the patient the benefits, risks, side-effects, alternatives, likelihood of achieving goals, and potential problems during recovery for the procedure that I have provided informed consent. Date  Time: {CHL [facility]-PAIN TIME CHOICES:[id number]}  Pre-Procedure Preparation:  Monitoring: As per clinic protocol. Respiration, ETCO2, SpO2, BP, heart rate and rhythm monitor placed and checked for adequate function Safety Precautions: Patient was assessed for positional comfort and pressure points before starting the procedure. Time-out: I initiated and conducted the "Time-out" before starting the procedure, as per protocol. The patient was asked to participate by confirming the accuracy of the "Time Out" information. Verification of the correct person, site, and procedure were performed and confirmed by me, the nursing staff, and the patient. "Time-out" conducted as per Joint Commission's Universal Protocol (UP.01.01.01). Time:    Description of Procedure:          Laterality: Bilateral. The procedure was performed in identical fashion on both sides. Levels:  L2, L3, L4, L5, & S1 Medial Branch Level(s) Area Prepped: Posterior Lumbosacral Region Prepping solution: ChloraPrep (2% chlorhexidine gluconate and 70% isopropyl alcohol) Safety Precautions: Aspiration looking for blood return was conducted prior to all injections. At no point did we inject any substances, as a needle was being advanced. Before injecting, the patient was told to immediately notify me if he was experiencing any new onset of "ringing in the ears, or metallic taste in the mouth". No attempts were made at seeking any paresthesias. Safe injection practices and needle disposal techniques used. Medications properly checked for expiration dates. SDV (single dose vial) medications used. After the completion of the  procedure, all disposable equipment used was discarded in the proper designated medical waste containers. Local Anesthesia: Protocol guidelines were followed. The patient was positioned over the fluoroscopy table. The area was prepped in the usual manner. The time-out was completed. The target area was identified using fluoroscopy. A 12-in long, straight, sterile hemostat was used with fluoroscopic guidance to  locate the targets for each level blocked. Once located, the skin was marked with an approved surgical skin marker. Once all sites were marked, the skin (epidermis, dermis, and hypodermis), as well as deeper tissues (fat, connective tissue and muscle) were infiltrated with a small amount of a short-acting local anesthetic, loaded on a 10cc syringe with a 25G, 1.5-in  Needle. An appropriate amount of time was allowed for local anesthetics to take effect before proceeding to the next step. Local Anesthetic: Lidocaine 2.0% The unused portion of the local anesthetic was discarded in the proper designated containers. Technical explanation of process:  L2 Medial Branch Nerve Block (MBB): The target area for the L2 medial branch is at the junction of the postero-lateral aspect of the superior articular process and the superior, posterior, and medial edge of the transverse process of L3. Under fluoroscopic guidance, a Quincke needle was inserted until contact was made with os over the superior postero-lateral aspect of the pedicular shadow (target area). After negative aspiration for blood, 0.5 mL of the nerve block solution was injected without difficulty or complication. The needle was removed intact. L3 Medial Branch Nerve Block (MBB): The target area for the L3 medial branch is at the junction of the postero-lateral aspect of the superior articular process and the superior, posterior, and medial edge of the transverse process of L4. Under fluoroscopic guidance, a Quincke needle was inserted until contact was  made with os over the superior postero-lateral aspect of the pedicular shadow (target area). After negative aspiration for blood, 0.5 mL of the nerve block solution was injected without difficulty or complication. The needle was removed intact. L4 Medial Branch Nerve Block (MBB): The target area for the L4 medial branch is at the junction of the postero-lateral aspect of the superior articular process and the superior, posterior, and medial edge of the transverse process of L5. Under fluoroscopic guidance, a Quincke needle was inserted until contact was made with os over the superior postero-lateral aspect of the pedicular shadow (target area). After negative aspiration for blood, 0.5 mL of the nerve block solution was injected without difficulty or complication. The needle was removed intact. L5 Medial Branch Nerve Block (MBB): The target area for the L5 medial branch is at the junction of the postero-lateral aspect of the superior articular process and the superior, posterior, and medial edge of the sacral ala. Under fluoroscopic guidance, a Quincke needle was inserted until contact was made with os over the superior postero-lateral aspect of the pedicular shadow (target area). After negative aspiration for blood, 0.5 mL of the nerve block solution was injected without difficulty or complication. The needle was removed intact. S1 Medial Branch Nerve Block (MBB): The target area for the S1 medial branch is at the posterior and inferior 6 o'clock position of the L5-S1 facet joint. Under fluoroscopic guidance, the Quincke needle inserted for the L5 MBB was redirected until contact was made with os over the inferior and postero aspect of the sacrum, at the 6 o' clock position under the L5-S1 facet joint (Target area). After negative aspiration for blood, 0.5 mL of the nerve block solution was injected without difficulty or complication. The needle was removed intact.  Nerve block solution: 0.2% PF-Ropivacaine +  Triamcinolone (40 mg/mL) diluted to a final concentration of 4 mg of Triamcinolone/mL of Ropivacaine The unused portion of the solution was discarded in the proper designated containers. Procedural Needles: 22-gauge, 3.5-inch, Quincke needles used for all levels.  Once the entire procedure was completed,  the treated area was cleaned, making sure to leave some of the prepping solution back to take advantage of its long term bactericidal properties.   Illustration of the posterior view of the lumbar spine and the posterior neural structures. Laminae of L2 through S1 are labeled. DPRL5, dorsal primary ramus of L5; DPRS1, dorsal primary ramus of S1; DPR3, dorsal primary ramus of L3; FJ, facet (zygapophyseal) joint L3-L4; I, inferior articular process of L4; LB1, lateral branch of dorsal primary ramus of L1; IAB, inferior articular branches from L3 medial branch (supplies L4-L5 facet joint); IBP, intermediate branch plexus; MB3, medial branch of dorsal primary ramus of L3; NR3, third lumbar nerve root; S, superior articular process of L5; SAB, superior articular branches from L4 (supplies L4-5 facet joint also); TP3, transverse process of L3.  There were no vitals filed for this visit.   Start Time:   hrs. End Time:   hrs.  Imaging Guidance (Spinal):          Type of Imaging Technique: Fluoroscopy Guidance (Spinal) Indication(s): Assistance in needle guidance and placement for procedures requiring needle placement in or near specific anatomical locations not easily accessible without such assistance. Exposure Time: Please see nurses notes. Contrast: None used. Fluoroscopic Guidance: I was personally present during the use of fluoroscopy. "Tunnel Vision Technique" used to obtain the best possible view of the target area. Parallax error corrected before commencing the procedure. "Direction-depth-direction" technique used to introduce the needle under continuous pulsed fluoroscopy. Once target was reached,  antero-posterior, oblique, and lateral fluoroscopic projection used confirm needle placement in all planes. Images permanently stored in EMR. Interpretation: No contrast injected. I personally interpreted the imaging intraoperatively. Adequate needle placement confirmed in multiple planes. Permanent images saved into the patient's record.  Antibiotic Prophylaxis:   Anti-infectives (From admission, onward)   None     Indication(s): None identified  Post-operative Assessment:  Post-procedure Vital Signs:  Pulse/HCG Rate:    Temp:   Resp:   BP:   SpO2:    EBL: None  Complications: No immediate post-treatment complications observed by team, or reported by patient.  Note: The patient tolerated the entire procedure well. A repeat set of vitals were taken after the procedure and the patient was kept under observation following institutional policy, for this type of procedure. Post-procedural neurological assessment was performed, showing return to baseline, prior to discharge. The patient was provided with post-procedure discharge instructions, including a section on how to identify potential problems. Should any problems arise concerning this procedure, the patient was given instructions to immediately contact us, at any time, without hesitation. In any case, we plan to contact the patient by telephone for a follow-up status report regarding this interventional procedure.  Comments:  No additional relevant information.  Plan of Care   Imaging Orders  No imaging studies ordered today   Procedure Orders    No procedure(s) ordered today    Medications ordered for procedure: No orders of the defined types were placed in this encounter.  Medications administered: Nicolas Colon had no medications administered during this visit.  See the medical record for exact dosing, route, and time of administration.  Disposition: Discharge home  Discharge Date & Time: 11/15/2018;   hrs.    Physician-requested Follow-up: No follow-ups on file.  Future Appointments  Date Time Provider Department Center  11/15/2018  9:45 AM Delano Metz, MD [facility] None   Primary Care Physician: [facility] Location[facility] Facility Note by: Oswaldo Done,  MD Date: 11/15/2018; Time: 5:42 AM  Disclaimer:  Medicine is not an Visual merchandiserexact science. The only guarantee in medicine is that nothing is guaranteed. It is important to note that the decision to proceed with this intervention was based on the information collected from the patient. The Data and conclusions were drawn from the patient's questionnaire, the interview, and the physical examination. Because the information was provided in large part by the patient, it cannot be guaranteed that it has not been purposely or unconsciously manipulated. Every effort has been made to obtain as much relevant data as possible for this evaluation. It is important to note that the conclusions that lead to this procedure are derived in large part from the available data. Always take into account that the treatment will also be dependent on availability of resources and existing treatment guidelines, considered by other Pain Management Practitioners as being common knowledge and practice, at the time of the intervention. For Medico-Legal purposes, it is also important to point out that variation in procedural techniques and pharmacological choices are the acceptable norm. The indications, contraindications, technique, and results of the above procedure should only be interpreted and judged by a Board-Certified Interventional Pain Specialist with extensive familiarity and expertise in the same exact procedure and technique.

## 2018-11-27 ENCOUNTER — Ambulatory Visit: Payer: Non-veteran care | Admitting: Pain Medicine

## 2018-11-27 NOTE — Progress Notes (Deleted)
Patient's Name: Nicolas Colon  MRN: [id number]  Referring Provider: [facility]  DOB: 17-Feb-1961  PCP: [facility]  DOS: 11/27/2018  Note by: Oswaldo Done, MD  Service setting: Ambulatory outpatient  Specialty: Interventional Pain Management  Patient type: Established  Location: [facility] (AMB) Pain Management Facility  Visit type: Interventional Procedure   Primary Reason for Visit: Interventional Pain Management Treatment. CC: No chief complaint on file.  Procedure:          Anesthesia, Analgesia, Anxiolysis:  Type: Lumbar Facet, Medial Branch Block(s) #1  Primary Purpose: Diagnostic Region: Posterolateral Lumbosacral Spine Level: L2, L3, L4, L5, & S1 Medial Branch Level(s). Injecting these levels blocks the L3-4, L4-5, and L5-S1 lumbar facet joints. Laterality: Bilateral  Type: Moderate (Conscious) Sedation combined with Local Anesthesia Indication(s): Analgesia and Anxiety Route: Intravenous (IV) IV Access: Secured Sedation: Meaningful verbal contact was maintained at all times during the procedure  Local Anesthetic: Lidocaine 1-2%  Position: Prone   Indications: 1. Spondylosis without myelopathy or radiculopathy, lumbosacral region   2. Lumbar facet syndrome (Bilateral) (R>L)   3. Lumbar facet hypertrophy (Bilateral: L2-3, L3-4, L5-S1) (Left: L4-5) (Right: L4-5 facetectomy)   4. Failed back surgical syndrome   5. DDD (degenerative disc disease), lumbar   6. Chronic low back pain (Primary Area of Pain) (Bilateral) (R>L) w/o sciatica    Pain Score: Pre-procedure:  /10 Post-procedure:  /10  Pre-op Assessment:  Nicolas Colon is a 58 y.o. (year old), male patient, seen today for interventional treatment. He  has a past surgical history that includes Carpal tunnel release (Left, 2014) and Back surgery (01/05/2017). Nicolas Colon has a current medication list which includes the following prescription(s): aspirin, augmented betamethasone dipropionate, baclofen,  budesonide-formoterol, bupropion, carboxymethylcellulose, divalproex, duloxetine, flunisolide, gabapentin, hydroxyzine, ipratropium, ipratropium-albuterol, ketotifen, lisinopril, meloxicam, montelukast, multiple vitamins-minerals, omeprazole, prazosin, quetiapine, ranitidine, sildenafil, sodium chloride, terazosin, trazodone, and vitamin d (cholecalciferol). His primarily concern today is the No chief complaint on file.  Initial Vital Signs:  Pulse/HCG Rate:    Temp:   Resp:   BP:   SpO2:    BMI: Estimated body mass index is 27.83 kg/m as calculated from the following:   Height as of 11/05/18: 5\' 8"  (1.727 m).   Weight as of 11/05/18: 183 lb (83 kg).  Risk Assessment: Allergies: Reviewed. He is allergic to aspirin; grass extracts [gramineae pollens]; hydrocodone; keflex [cephalexin]; and niacin and related.  Allergy Precautions: None required Coagulopathies: Reviewed. None identified.  Blood-thinner therapy: None at this time Active Infection(s): Reviewed. None identified. Nicolas Colon is afebrile  Site Confirmation: Nicolas Colon was asked to confirm the procedure and laterality before marking the site Procedure checklist: Completed Consent: Before the procedure and under the influence of no sedative(s), amnesic(s), or anxiolytics, the patient was informed of the treatment options, risks and possible complications. To fulfill our ethical and legal obligations, as recommended by the American Medical Association's Code of Ethics, I have informed the patient of my clinical impression; the nature and purpose of the treatment or procedure; the risks, benefits, and possible complications of the intervention; the alternatives, including doing nothing; the risk(s) and benefit(s) of the alternative treatment(s) or procedure(s); and the risk(s) and benefit(s) of doing nothing. The patient was provided information about the general risks and possible complications associated with the procedure. These may  include, but are not limited to: failure to achieve desired goals, infection, bleeding, organ or nerve damage, allergic reactions, paralysis, and death. In addition, the patient was informed of those  risks and complications associated to Spine-related procedures, such as failure to decrease pain; infection (i.e.: Meningitis, epidural or intraspinal abscess); bleeding (i.e.: epidural hematoma, subarachnoid hemorrhage, or any other type of intraspinal or peri-dural bleeding); organ or nerve damage (i.e.: Any type of peripheral nerve, nerve root, or spinal cord injury) with subsequent damage to sensory, motor, and/or autonomic systems, resulting in permanent pain, numbness, and/or weakness of one or several areas of the body; allergic reactions; (i.e.: anaphylactic reaction); and/or death. Furthermore, the patient was informed of those risks and complications associated with the medications. These include, but are not limited to: allergic reactions (i.e.: anaphylactic or anaphylactoid reaction(s)); adrenal axis suppression; blood sugar elevation that in diabetics may result in ketoacidosis or comma; water retention that in patients with history of congestive heart failure may result in shortness of breath, pulmonary edema, and decompensation with resultant heart failure; weight gain; swelling or edema; medication-induced neural toxicity; particulate matter embolism and blood vessel occlusion with resultant organ, and/or nervous system infarction; and/or aseptic necrosis of one or more joints. Finally, the patient was informed that Medicine is not an exact science; therefore, there is also the possibility of unforeseen or unpredictable risks and/or possible complications that may result in a catastrophic outcome. The patient indicated having understood very clearly. We have given the patient no guarantees and we have made no promises. Enough time was given to the patient to ask questions, all of which were answered  to the patient's satisfaction. Nicolas Colon has indicated that he wanted to continue with the procedure. Attestation: I, the ordering provider, attest that I have discussed with the patient the benefits, risks, side-effects, alternatives, likelihood of achieving goals, and potential problems during recovery for the procedure that I have provided informed consent. Date  Time: {CHL [facility]-PAIN TIME CHOICES:[id number]}  Pre-Procedure Preparation:  Monitoring: As per clinic protocol. Respiration, ETCO2, SpO2, BP, heart rate and rhythm monitor placed and checked for adequate function Safety Precautions: Patient was assessed for positional comfort and pressure points before starting the procedure. Time-out: I initiated and conducted the "Time-out" before starting the procedure, as per protocol. The patient was asked to participate by confirming the accuracy of the "Time Out" information. Verification of the correct person, site, and procedure were performed and confirmed by me, the nursing staff, and the patient. "Time-out" conducted as per Joint Commission's Universal Protocol (UP.01.01.01). Time:    Description of Procedure:          Laterality: Bilateral. The procedure was performed in identical fashion on both sides. Levels:  L2, L3, L4, L5, & S1 Medial Branch Level(s) Area Prepped: Posterior Lumbosacral Region Prepping solution: ChloraPrep (2% chlorhexidine gluconate and 70% isopropyl alcohol) Safety Precautions: Aspiration looking for blood return was conducted prior to all injections. At no point did we inject any substances, as a needle was being advanced. Before injecting, the patient was told to immediately notify me if he was experiencing any new onset of "ringing in the ears, or metallic taste in the mouth". No attempts were made at seeking any paresthesias. Safe injection practices and needle disposal techniques used. Medications properly checked for expiration dates. SDV (single dose vial)  medications used. After the completion of the procedure, all disposable equipment used was discarded in the proper designated medical waste containers. Local Anesthesia: Protocol guidelines were followed. The patient was positioned over the fluoroscopy table. The area was prepped in the usual manner. The time-out was completed. The target area was identified using fluoroscopy. A 12-in long, straight, sterile  hemostat was used with fluoroscopic guidance to locate the targets for each level blocked. Once located, the skin was marked with an approved surgical skin marker. Once all sites were marked, the skin (epidermis, dermis, and hypodermis), as well as deeper tissues (fat, connective tissue and muscle) were infiltrated with a small amount of a short-acting local anesthetic, loaded on a 10cc syringe with a 25G, 1.5-in  Needle. An appropriate amount of time was allowed for local anesthetics to take effect before proceeding to the next step. Local Anesthetic: Lidocaine 2.0% The unused portion of the local anesthetic was discarded in the proper designated containers. Technical explanation of process:  L2 Medial Branch Nerve Block (MBB): The target area for the L2 medial branch is at the junction of the postero-lateral aspect of the superior articular process and the superior, posterior, and medial edge of the transverse process of L3. Under fluoroscopic guidance, a Quincke needle was inserted until contact was made with os over the superior postero-lateral aspect of the pedicular shadow (target area). After negative aspiration for blood, 0.5 mL of the nerve block solution was injected without difficulty or complication. The needle was removed intact. L3 Medial Branch Nerve Block (MBB): The target area for the L3 medial branch is at the junction of the postero-lateral aspect of the superior articular process and the superior, posterior, and medial edge of the transverse process of L4. Under fluoroscopic guidance, a  Quincke needle was inserted until contact was made with os over the superior postero-lateral aspect of the pedicular shadow (target area). After negative aspiration for blood, 0.5 mL of the nerve block solution was injected without difficulty or complication. The needle was removed intact. L4 Medial Branch Nerve Block (MBB): The target area for the L4 medial branch is at the junction of the postero-lateral aspect of the superior articular process and the superior, posterior, and medial edge of the transverse process of L5. Under fluoroscopic guidance, a Quincke needle was inserted until contact was made with os over the superior postero-lateral aspect of the pedicular shadow (target area). After negative aspiration for blood, 0.5 mL of the nerve block solution was injected without difficulty or complication. The needle was removed intact. L5 Medial Branch Nerve Block (MBB): The target area for the L5 medial branch is at the junction of the postero-lateral aspect of the superior articular process and the superior, posterior, and medial edge of the sacral ala. Under fluoroscopic guidance, a Quincke needle was inserted until contact was made with os over the superior postero-lateral aspect of the pedicular shadow (target area). After negative aspiration for blood, 0.5 mL of the nerve block solution was injected without difficulty or complication. The needle was removed intact. S1 Medial Branch Nerve Block (MBB): The target area for the S1 medial branch is at the posterior and inferior 6 o'clock position of the L5-S1 facet joint. Under fluoroscopic guidance, the Quincke needle inserted for the L5 MBB was redirected until contact was made with os over the inferior and postero aspect of the sacrum, at the 6 o' clock position under the L5-S1 facet joint (Target area). After negative aspiration for blood, 0.5 mL of the nerve block solution was injected without difficulty or complication. The needle was removed  intact.  Nerve block solution: 0.2% PF-Ropivacaine + Triamcinolone (40 mg/mL) diluted to a final concentration of 4 mg of Triamcinolone/mL of Ropivacaine The unused portion of the solution was discarded in the proper designated containers. Procedural Needles: 22-gauge, 3.5-inch, Quincke needles used for all levels.  Once the entire procedure was completed, the treated area was cleaned, making sure to leave some of the prepping solution back to take advantage of its long term bactericidal properties.   Illustration of the posterior view of the lumbar spine and the posterior neural structures. Laminae of L2 through S1 are labeled. DPRL5, dorsal primary ramus of L5; DPRS1, dorsal primary ramus of S1; DPR3, dorsal primary ramus of L3; FJ, facet (zygapophyseal) joint L3-L4; I, inferior articular process of L4; LB1, lateral branch of dorsal primary ramus of L1; IAB, inferior articular branches from L3 medial branch (supplies L4-L5 facet joint); IBP, intermediate branch plexus; MB3, medial branch of dorsal primary ramus of L3; NR3, third lumbar nerve root; S, superior articular process of L5; SAB, superior articular branches from L4 (supplies L4-5 facet joint also); TP3, transverse process of L3.  There were no vitals filed for this visit.   Start Time:   hrs. End Time:   hrs.  Imaging Guidance (Spinal):          Type of Imaging Technique: Fluoroscopy Guidance (Spinal) Indication(s): Assistance in needle guidance and placement for procedures requiring needle placement in or near specific anatomical locations not easily accessible without such assistance. Exposure Time: Please see nurses notes. Contrast: None used. Fluoroscopic Guidance: I was personally present during the use of fluoroscopy. "Tunnel Vision Technique" used to obtain the best possible view of the target area. Parallax error corrected before commencing the procedure. "Direction-depth-direction" technique used to introduce the needle under  continuous pulsed fluoroscopy. Once target was reached, antero-posterior, oblique, and lateral fluoroscopic projection used confirm needle placement in all planes. Images permanently stored in EMR. Interpretation: No contrast injected. I personally interpreted the imaging intraoperatively. Adequate needle placement confirmed in multiple planes. Permanent images saved into the patient's record.  Antibiotic Prophylaxis:   Anti-infectives (From admission, onward)   None     Indication(s): None identified  Post-operative Assessment:  Post-procedure Vital Signs:  Pulse/HCG Rate:    Temp:   Resp:   BP:   SpO2:    EBL: None  Complications: No immediate post-treatment complications observed by team, or reported by patient.  Note: The patient tolerated the entire procedure well. A repeat set of vitals were taken after the procedure and the patient was kept under observation following institutional policy, for this type of procedure. Post-procedural neurological assessment was performed, showing return to baseline, prior to discharge. The patient was provided with post-procedure discharge instructions, including a section on how to identify potential problems. Should any problems arise concerning this procedure, the patient was given instructions to immediately contact us, at any time, without hesitation. In any case, we plan to contact the patient by telephone for a follow-up status report regarding this interventional procedure.  Comments:  No additional relevant information.  Plan of Care   Imaging Orders  No imaging studies ordered today   Procedure Orders    No procedure(s) ordered today    Medications ordered for procedure: No orders of the defined types were placed in this encounter.  Medications administered: Nicolas Colon had no medications administered during this visit.  See the medical record for exact dosing, route, and time of administration.  Disposition: Discharge home   Discharge Date & Time: 11/27/2018;   hrs.   Physician-requested Follow-up: No follow-ups on file.  Future Appointments  Date Time Provider Department Center  11/27/2018  9:15 AM Delano Metz, MD [facility] None   Primary Care Physician: [facility] Location: [facility]  Facility Note by: Oswaldo Done, MD Date: 11/27/2018; Time: 6:42 AM  Disclaimer:  Medicine is not an Visual merchandiser. The only guarantee in medicine is that nothing is guaranteed. It is important to note that the decision to proceed with this intervention was based on the information collected from the patient. The Data and conclusions were drawn from the patient's questionnaire, the interview, and the physical examination. Because the information was provided in large part by the patient, it cannot be guaranteed that it has not been purposely or unconsciously manipulated. Every effort has been made to obtain as much relevant data as possible for this evaluation. It is important to note that the conclusions that lead to this procedure are derived in large part from the available data. Always take into account that the treatment will also be dependent on availability of resources and existing treatment guidelines, considered by other Pain Management Practitioners as being common knowledge and practice, at the time of the intervention. For Medico-Legal purposes, it is also important to point out that variation in procedural techniques and pharmacological choices are the acceptable norm. The indications, contraindications, technique, and results of the above procedure should only be interpreted and judged by a Board-Certified Interventional Pain Specialist with extensive familiarity and expertise in the same exact procedure and technique.

## 2018-12-11 ENCOUNTER — Ambulatory Visit: Payer: Non-veteran care | Admitting: Pain Medicine

## 2018-12-11 NOTE — Progress Notes (Deleted)
Patient's Name: Nicolas Colon  MRN: [id number]  Referring Provider: [facility]  DOB: 1961/01/18  PCP: [facility]  DOS: 12/11/2018  Note by: Oswaldo Done, MD  Service setting: Ambulatory outpatient  Specialty: Interventional Pain Management  Patient type: Established  Location: [facility] (AMB) Pain Management Facility  Visit type: Interventional Procedure   Primary Reason for Visit: Interventional Pain Management Treatment. CC: No chief complaint on file.  Procedure:          Anesthesia, Analgesia, Anxiolysis:  Type: Lumbar Facet, Medial Branch Block(s) #1  Primary Purpose: Diagnostic Region: Posterolateral Lumbosacral Spine Level: L2, L3, L4, L5, & S1 Medial Branch Level(s). Injecting these levels blocks the L3-4, L4-5, and L5-S1 lumbar facet joints. Laterality: Bilateral  Type: Moderate (Conscious) Sedation combined with Local Anesthesia Indication(s): Analgesia and Anxiety Route: Intravenous (IV) IV Access: Secured Sedation: Meaningful verbal contact was maintained at all times during the procedure  Local Anesthetic: Lidocaine 1-2%  Position: Prone   Indications: 1. Spondylosis without myelopathy or radiculopathy, lumbosacral region   2. Lumbar facet syndrome (Bilateral) (R>L)   3. Lumbar facet hypertrophy (Bilateral: L2-3, L3-4, L5-S1) (Left: L4-5) (Right: L4-5 facetectomy)   4. DDD (degenerative disc disease), lumbar   5. Chronic low back pain (Primary Area of Pain) (Bilateral) (R>L) w/o sciatica    Pain Score: Pre-procedure:  /10 Post-procedure:  /10  Pre-op Assessment:  Mr. Spiewak is a 58 y.o. (year old), male patient, seen today for interventional treatment. He  has a past surgical history that includes Carpal tunnel release (Left, 2014) and Back surgery (01/05/2017). Mr. Dangerfield has a current medication list which includes the following prescription(s): aspirin, augmented betamethasone dipropionate, baclofen, budesonide-formoterol, bupropion,  carboxymethylcellulose, divalproex, duloxetine, flunisolide, gabapentin, hydroxyzine, ipratropium, ipratropium-albuterol, ketotifen, lisinopril, meloxicam, montelukast, multiple vitamins-minerals, omeprazole, prazosin, quetiapine, ranitidine, sildenafil, sodium chloride, terazosin, trazodone, and vitamin d (cholecalciferol). His primarily concern today is the No chief complaint on file.  Initial Vital Signs:  Pulse/HCG Rate:    Temp:   Resp:   BP:   SpO2:    BMI: Estimated body mass index is 27.83 kg/m as calculated from the following:   Height as of 11/05/18:  (1.727 m).   Weight as of 11/05/18: 183 lb (83 kg).  Risk Assessment: Allergies: Reviewed. He is allergic to aspirin; grass extracts [gramineae pollens]; hydrocodone; keflex [cephalexin]; and niacin and related.  Allergy Precautions: None required Coagulopathies: Reviewed. None identified.  Blood-thinner therapy: None at this time Active Infection(s): Reviewed. None identified. Mr. Robinette is afebrile  Site Confirmation: Mr. Azizi was asked to confirm the procedure and laterality before marking the site Procedure checklist: Completed Consent: Before the procedure and under the influence of no sedative(s), amnesic(s), or anxiolytics, the patient was informed of the treatment options, risks and possible complications. To fulfill our ethical and legal obligations, as recommended by the American Medical Association's Code of Ethics, I have informed the patient of my clinical impression; the nature and purpose of the treatment or procedure; the risks, benefits, and possible complications of the intervention; the alternatives, including doing nothing; the risk(s) and benefit(s) of the alternative treatment(s) or procedure(s); and the risk(s) and benefit(s) of doing nothing. The patient was provided information about the general risks and possible complications associated with the procedure. These may include, but are not limited to: failure  to achieve desired goals, infection, bleeding, organ or nerve damage, allergic reactions, paralysis, and death. In addition, the patient was informed of those risks and complications associated to Spine-related procedures,  such as failure to decrease pain; infection (i.e.: Meningitis, epidural or intraspinal abscess); bleeding (i.e.: epidural hematoma, subarachnoid hemorrhage, or any other type of intraspinal or peri-dural bleeding); organ or nerve damage (i.e.: Any type of peripheral nerve, nerve root, or spinal cord injury) with subsequent damage to sensory, motor, and/or autonomic systems, resulting in permanent pain, numbness, and/or weakness of one or several areas of the body; allergic reactions; (i.e.: anaphylactic reaction); and/or death. Furthermore, the patient was informed of those risks and complications associated with the medications. These include, but are not limited to: allergic reactions (i.e.: anaphylactic or anaphylactoid reaction(s)); adrenal axis suppression; blood sugar elevation that in diabetics may result in ketoacidosis or comma; water retention that in patients with history of congestive heart failure may result in shortness of breath, pulmonary edema, and decompensation with resultant heart failure; weight gain; swelling or edema; medication-induced neural toxicity; particulate matter embolism and blood vessel occlusion with resultant organ, and/or nervous system infarction; and/or aseptic necrosis of one or more joints. Finally, the patient was informed that Medicine is not an exact science; therefore, there is also the possibility of unforeseen or unpredictable risks and/or possible complications that may result in a catastrophic outcome. The patient indicated having understood very clearly. We have given the patient no guarantees and we have made no promises. Enough time was given to the patient to ask questions, all of which were answered to the patient's satisfaction. Mr. Padelford  has indicated that he wanted to continue with the procedure. Attestation: I, the ordering provider, attest that I have discussed with the patient the benefits, risks, side-effects, alternatives, likelihood of achieving goals, and potential problems during recovery for the procedure that I have provided informed consent. Date   Time: {CHL [facility]-PAIN TIME CHOICES:21018001}  Pre-Procedure Preparation:  Monitoring: As per clinic protocol. Respiration, ETCO2, SpO2, BP, heart rate and rhythm monitor placed and checked for adequate function Safety Precautions: Patient was assessed for positional comfort and pressure points before starting the procedure. Time-out: I initiated and conducted the "Time-out" before starting the procedure, as per protocol. The patient was asked to participate by confirming the accuracy of the "Time Out" information. Verification of the correct person, site, and procedure were performed and confirmed by me, the nursing staff, and the patient. "Time-out" conducted as per Joint Commission's Universal Protocol (UP.01.01.01). Time:    Description of Procedure:          Laterality: Bilateral. The procedure was performed in identical fashion on both sides. Levels:  L2, L3, L4, L5, & S1 Medial Branch Level(s) Area Prepped: Posterior Lumbosacral Region Prepping solution: ChloraPrep (2% chlorhexidine gluconate and 70% isopropyl alcohol) Safety Precautions: Aspiration looking for blood return was conducted prior to all injections. At no point did we inject any substances, as a needle was being advanced. Before injecting, the patient was told to immediately notify me if he was experiencing any new onset of "ringing in the ears, or metallic taste in the mouth". No attempts were made at seeking any paresthesias. Safe injection practices and needle disposal techniques used. Medications properly checked for expiration dates. SDV (single dose vial) medications used. After the completion of the  procedure, all disposable equipment used was discarded in the proper designated medical waste containers. Local Anesthesia: Protocol guidelines were followed. The patient was positioned over the fluoroscopy table. The area was prepped in the usual manner. The time-out was completed. The target area was identified using fluoroscopy. A 12-in long, straight, sterile hemostat was used with fluoroscopic guidance  to locate the targets for each level blocked. Once located, the skin was marked with an approved surgical skin marker. Once all sites were marked, the skin (epidermis, dermis, and hypodermis), as well as deeper tissues (fat, connective tissue and muscle) were infiltrated with a small amount of a short-acting local anesthetic, loaded on a 10cc syringe with a 25G, 1.5-in  Needle. An appropriate amount of time was allowed for local anesthetics to take effect before proceeding to the next step. Local Anesthetic: Lidocaine 2.0% The unused portion of the local anesthetic was discarded in the proper designated containers. Technical explanation of process:  L2 Medial Branch Nerve Block (MBB): The target area for the L2 medial branch is at the junction of the postero-lateral aspect of the superior articular process and the superior, posterior, and medial edge of the transverse process of L3. Under fluoroscopic guidance, a Quincke needle was inserted until contact was made with os over the superior postero-lateral aspect of the pedicular shadow (target area). After negative aspiration for blood, 0.5 mL of the nerve block solution was injected without difficulty or complication. The needle was removed intact. L3 Medial Branch Nerve Block (MBB): The target area for the L3 medial branch is at the junction of the postero-lateral aspect of the superior articular process and the superior, posterior, and medial edge of the transverse process of L4. Under fluoroscopic guidance, a Quincke needle was inserted until contact was  made with os over the superior postero-lateral aspect of the pedicular shadow (target area). After negative aspiration for blood, 0.5 mL of the nerve block solution was injected without difficulty or complication. The needle was removed intact. L4 Medial Branch Nerve Block (MBB): The target area for the L4 medial branch is at the junction of the postero-lateral aspect of the superior articular process and the superior, posterior, and medial edge of the transverse process of L5. Under fluoroscopic guidance, a Quincke needle was inserted until contact was made with os over the superior postero-lateral aspect of the pedicular shadow (target area). After negative aspiration for blood, 0.5 mL of the nerve block solution was injected without difficulty or complication. The needle was removed intact. L5 Medial Branch Nerve Block (MBB): The target area for the L5 medial branch is at the junction of the postero-lateral aspect of the superior articular process and the superior, posterior, and medial edge of the sacral ala. Under fluoroscopic guidance, a Quincke needle was inserted until contact was made with os over the superior postero-lateral aspect of the pedicular shadow (target area). After negative aspiration for blood, 0.5 mL of the nerve block solution was injected without difficulty or complication. The needle was removed intact. S1 Medial Branch Nerve Block (MBB): The target area for the S1 medial branch is at the posterior and inferior 6 o'clock position of the L5-S1 facet joint. Under fluoroscopic guidance, the Quincke needle inserted for the L5 MBB was redirected until contact was made with os over the inferior and postero aspect of the sacrum, at the 6 o' clock position under the L5-S1 facet joint (Target area). After negative aspiration for blood, 0.5 mL of the nerve block solution was injected without difficulty or complication. The needle was removed intact.  Nerve block solution: 0.2% PF-Ropivacaine +  Triamcinolone (40 mg/mL) diluted to a final concentration of 4 mg of Triamcinolone/mL of Ropivacaine The unused portion of the solution was discarded in the proper designated containers. Procedural Needles: 22-gauge, 3.5-inch, Quincke needles used for all levels.  Once the entire procedure was  completed, the treated area was cleaned, making sure to leave some of the prepping solution back to take advantage of its long term bactericidal properties.   Illustration of the posterior view of the lumbar spine and the posterior neural structures. Laminae of L2 through S1 are labeled. DPRL5, dorsal primary ramus of L5; DPRS1, dorsal primary ramus of S1; DPR3, dorsal primary ramus of L3; FJ, facet (zygapophyseal) joint L3-L4; I, inferior articular process of L4; LB1, lateral branch of dorsal primary ramus of L1; IAB, inferior articular branches from L3 medial branch (supplies L4-L5 facet joint); IBP, intermediate branch plexus; MB3, medial branch of dorsal primary ramus of L3; NR3, third lumbar nerve root; S, superior articular process of L5; SAB, superior articular branches from L4 (supplies L4-5 facet joint also); TP3, transverse process of L3.  There were no vitals filed for this visit.   Start Time:   hrs. End Time:   hrs.  Imaging Guidance (Spinal):          Type of Imaging Technique: Fluoroscopy Guidance (Spinal) Indication(s): Assistance in needle guidance and placement for procedures requiring needle placement in or near specific anatomical locations not easily accessible without such assistance. Exposure Time: Please see nurses notes. Contrast: None used. Fluoroscopic Guidance: I was personally present during the use of fluoroscopy. "Tunnel Vision Technique" used to obtain the best possible view of the target area. Parallax error corrected before commencing the procedure. "Direction-depth-direction" technique used to introduce the needle under continuous pulsed fluoroscopy. Once target was reached,  antero-posterior, oblique, and lateral fluoroscopic projection used confirm needle placement in all planes. Images permanently stored in EMR. Interpretation: No contrast injected. I personally interpreted the imaging intraoperatively. Adequate needle placement confirmed in multiple planes. Permanent images saved into the patient's record.  Antibiotic Prophylaxis:   Anti-infectives (From admission, onward)   None     Indication(s): None identified  Post-operative Assessment:  Post-procedure Vital Signs:  Pulse/HCG Rate:    Temp:   Resp:   BP:   SpO2:    EBL: None  Complications: No immediate post-treatment complications observed by team, or reported by patient.  Note: The patient tolerated the entire procedure well. A repeat set of vitals were taken after the procedure and the patient was kept under observation following institutional policy, for this type of procedure. Post-procedural neurological assessment was performed, showing return to baseline, prior to discharge. The patient was provided with post-procedure discharge instructions, including a section on how to identify potential problems. Should any problems arise concerning this procedure, the patient was given instructions to immediately contact us, at any time, without hesitation. In any case, we plan to contact the patient by telephone for a follow-up status report regarding this interventional procedure.  Comments:  No additional relevant information.  Plan of Care  Orders:  No orders of the defined types were placed in this encounter.  Medications ordered for procedure: No orders of the defined types were placed in this encounter.  Medications administered: Rushawn Lynam had no medications administered during this visit.  See the medical record for exact dosing, route, and time of administration.  Disposition: Discharge home  Discharge Date & Time: 12/11/2018;   hrs.   Follow-up plan:   No follow-ups on file.      Future Appointments  Date Time Provider Department Center  12/11/2018  9:15 AM Delano Metz, MD [facility] None   Primary Care Physician: [facility] Location: [facility] Note by: Oswaldo Done, MD Date: 12/11/2018;  Time: 6:28 AM  Disclaimer:  Medicine is not an Visual merchandiser. The only guarantee in medicine is that nothing is guaranteed. It is important to note that the decision to proceed with this intervention was based on the information collected from the patient. The Data and conclusions were drawn from the patient's questionnaire, the interview, and the physical examination. Because the information was provided in large part by the patient, it cannot be guaranteed that it has not been purposely or unconsciously manipulated. Every effort has been made to obtain as much relevant data as possible for this evaluation. It is important to note that the conclusions that lead to this procedure are derived in large part from the available data. Always take into account that the treatment will also be dependent on availability of resources and existing treatment guidelines, considered by other Pain Management Practitioners as being common knowledge and practice, at the time of the intervention. For Medico-Legal purposes, it is also important to point out that variation in procedural techniques and pharmacological choices are the acceptable norm. The indications, contraindications, technique, and results of the above procedure should only be interpreted and judged by a Board-Certified Interventional Pain Specialist with extensive familiarity and expertise in the same exact procedure and technique.

## 2019-03-11 IMAGING — CR DG SHOULDER 2+V*R*
3 series · 3 of 3 positions shown · non-contrast
Comparison: None.

CLINICAL DATA: Chronic right shoulder pain.

EXAM:
RIGHT SHOULDER - 2+ VIEW

[shoulder grashey]
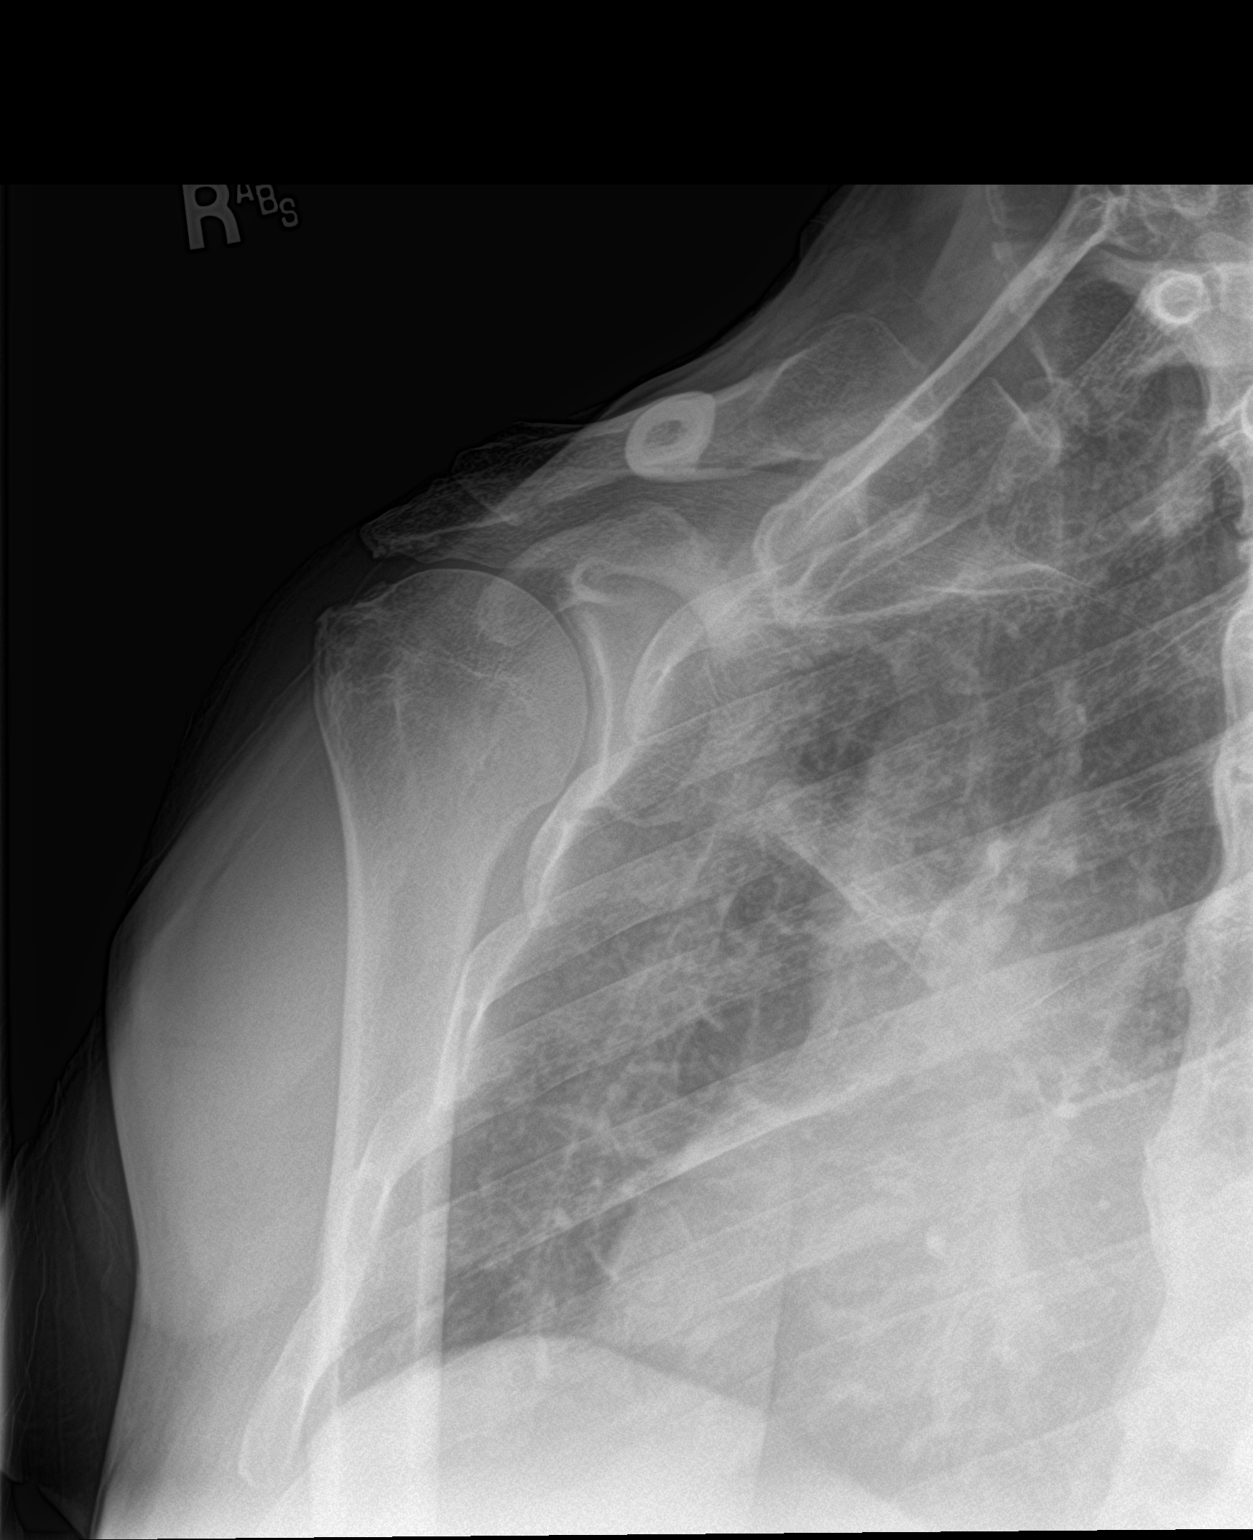

[shoulder y view]
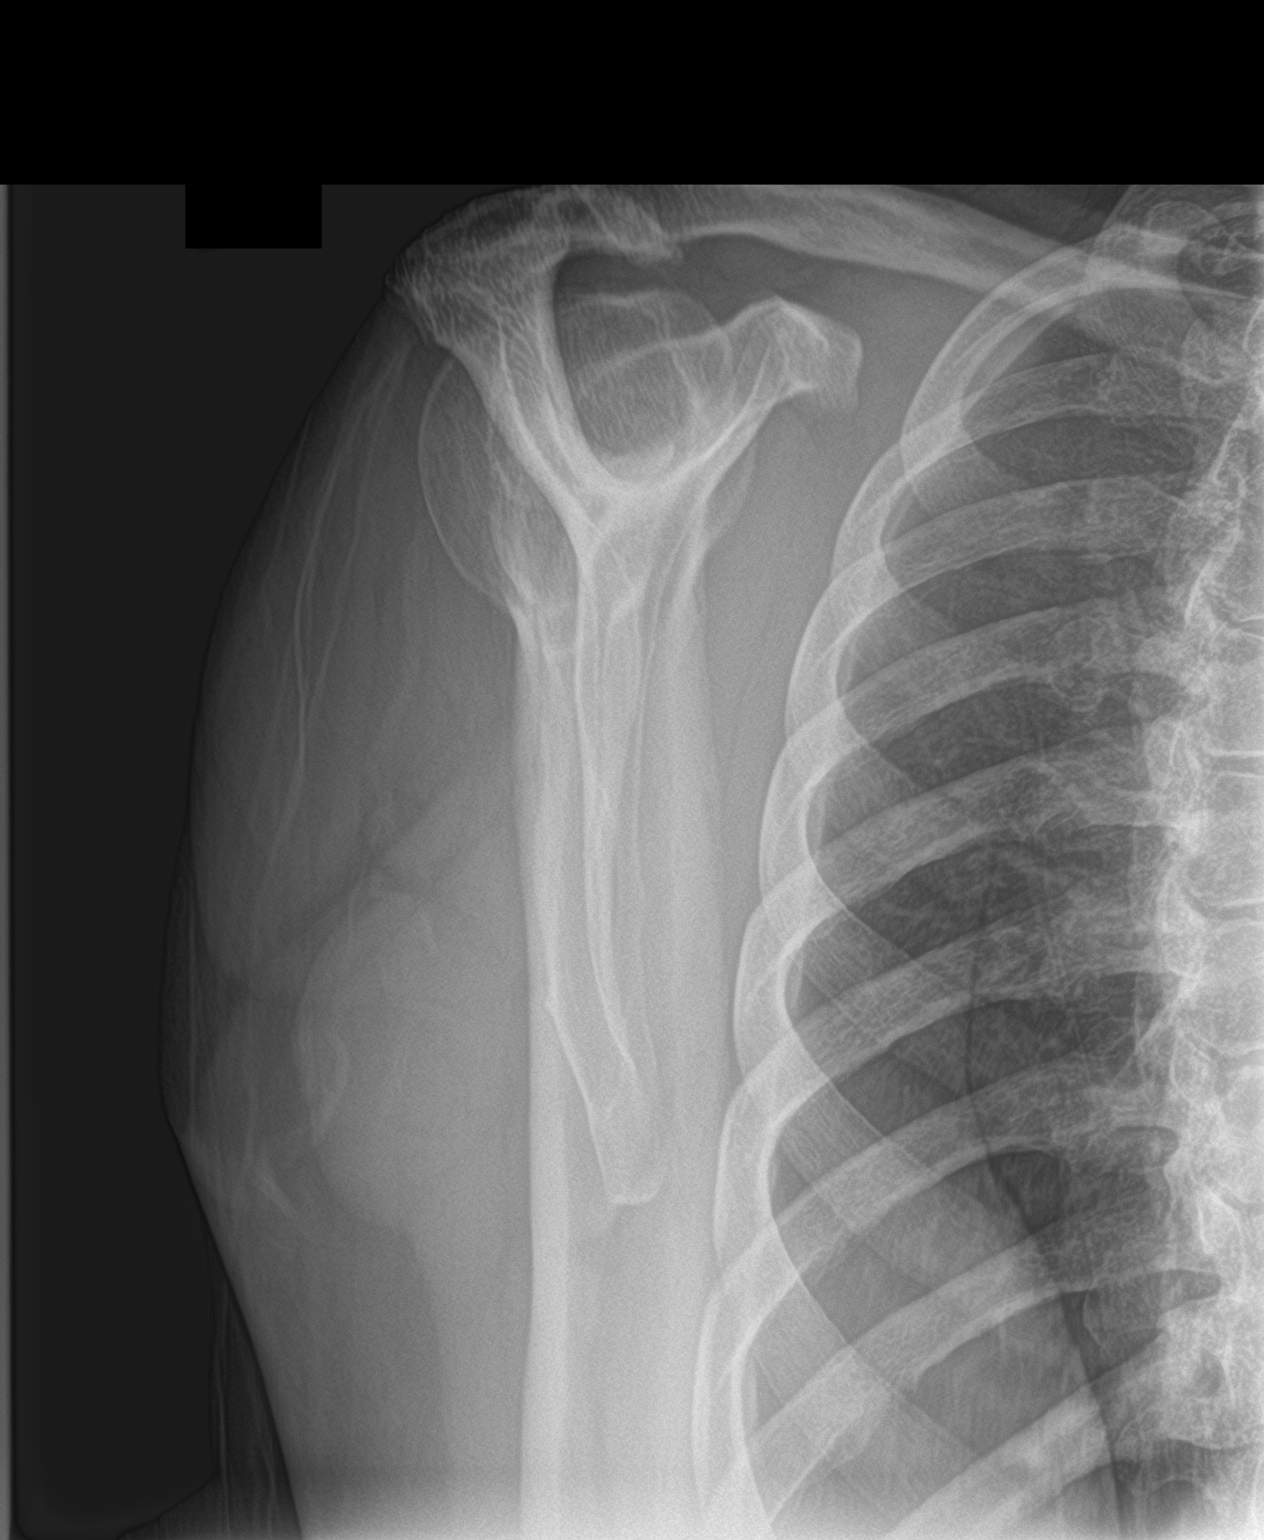

[shoulder axillary]
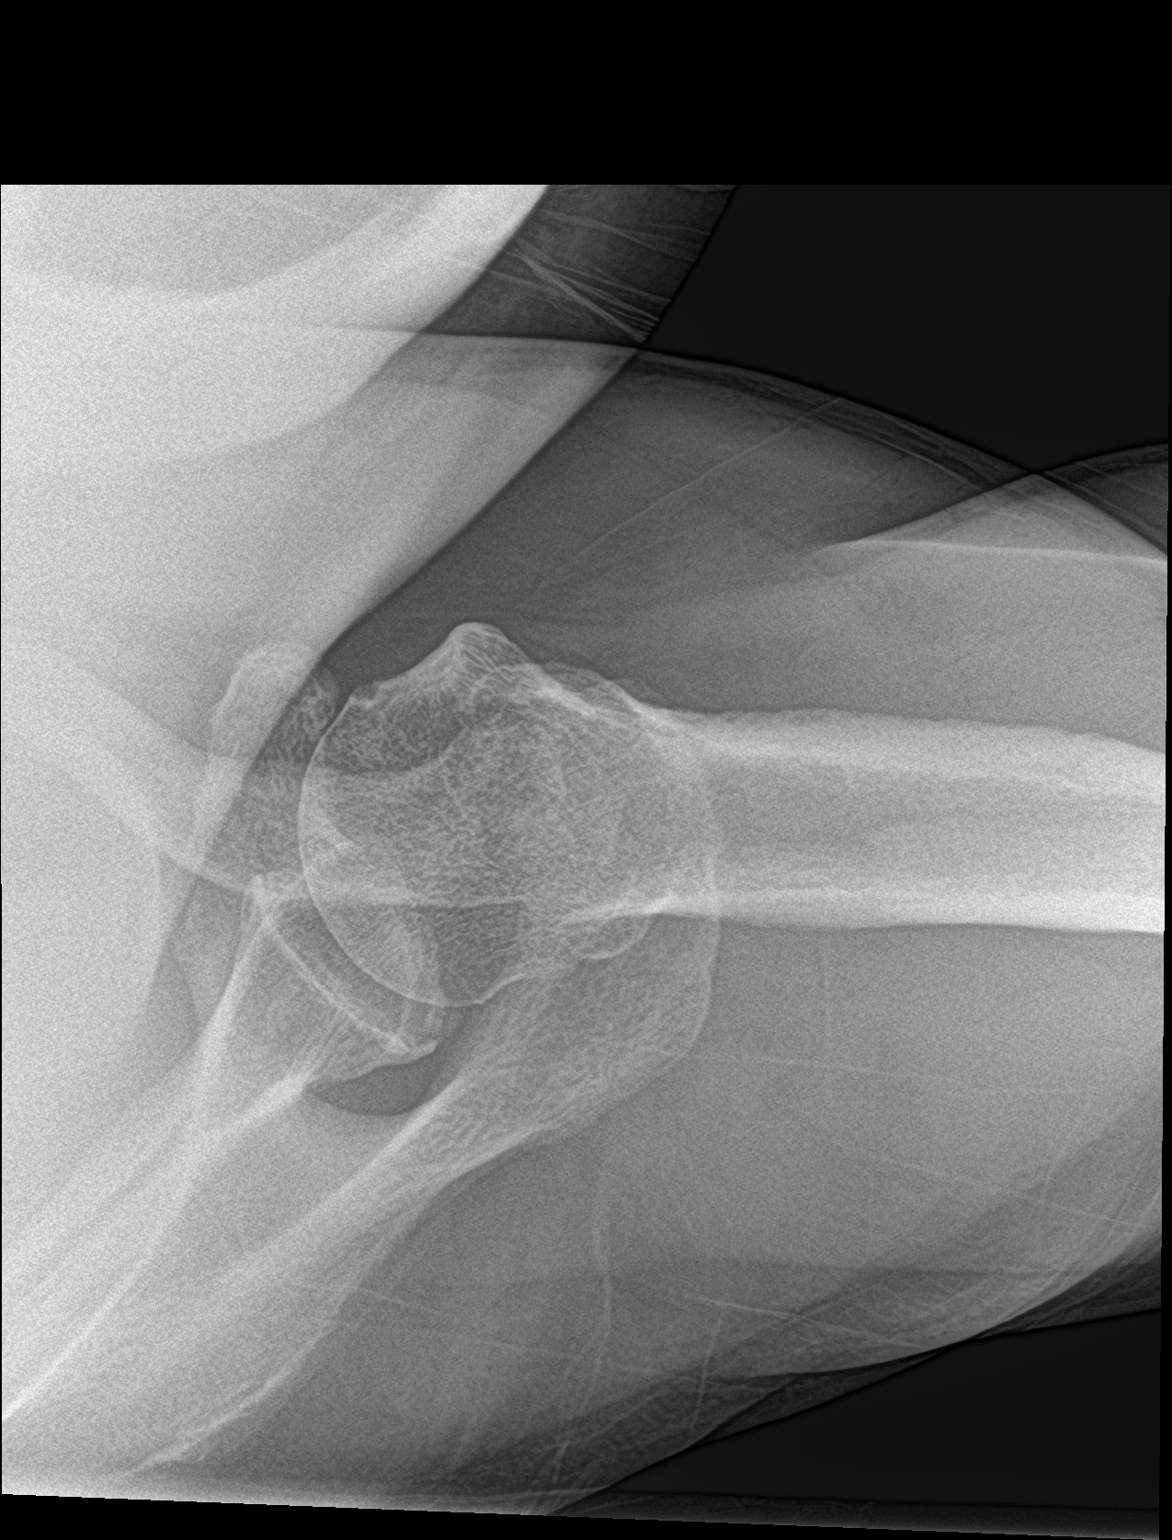

[3 of 3 positions shown; findings below may reference images not displayed]

FINDINGS: Right shoulder is located without a fracture. Irregularity along the
posterior right glenoid is most compatible with osteoarthritis.
Normal alignment at the right AC joint. Cortical thickening
involving the right eighth and ninth ribs and suspect old fractures.
IMPRESSION: No acute abnormality to the right shoulder.

Degenerative changes at the glenohumeral joint.

## 2019-04-13 IMAGING — CR DG HIP (WITH OR WITHOUT PELVIS) 2-3V*R*
1 series · 3 of 3 positions shown · non-contrast
Comparison: 10/04/2018 sacroiliac joint film.

CLINICAL DATA: 57-year-old male with chronic back pain. No known
injury. Bilateral hip pain (worse on the right). Subsequent
encounter.

EXAM:
DG HIP (WITH OR WITHOUT PELVIS) 2-3V RIGHT

[Series 1: dg hip unilat w or w/o pelvis 2-3 views  · non-contrast · 0.14mm/px · 3 of 3 slices shown]
[im 1/3]
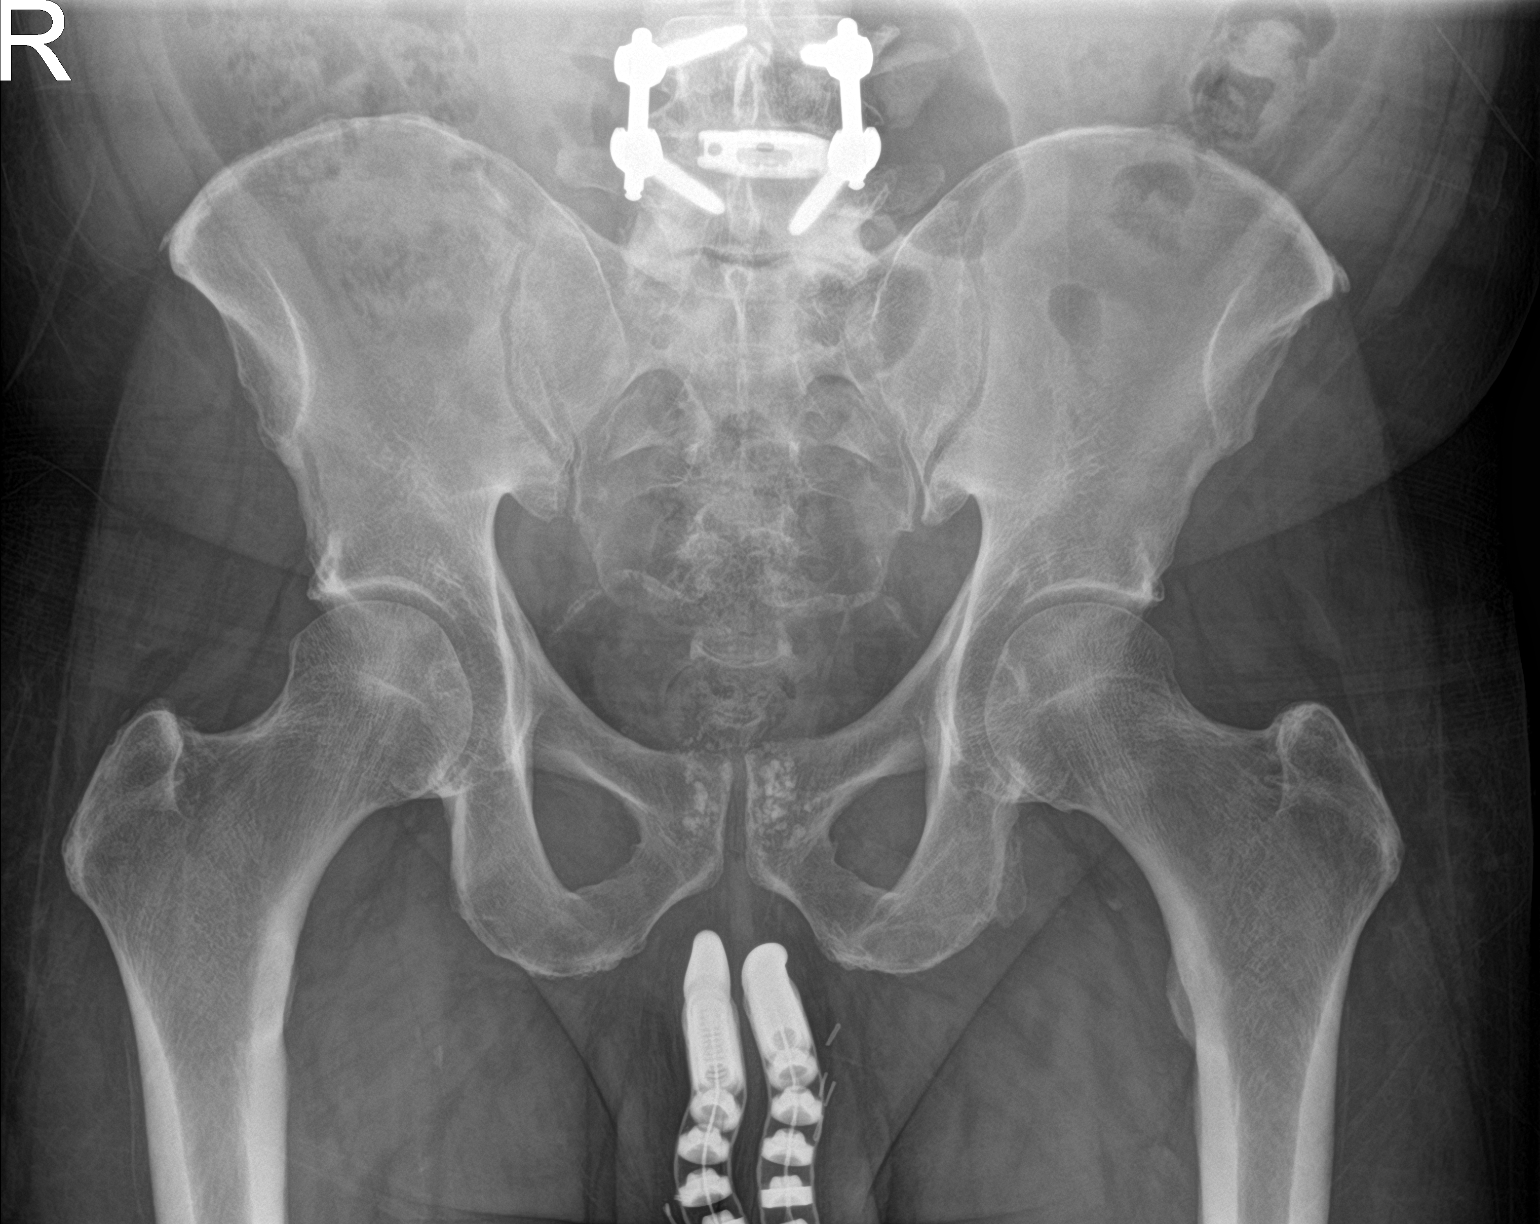
[im 2/3]
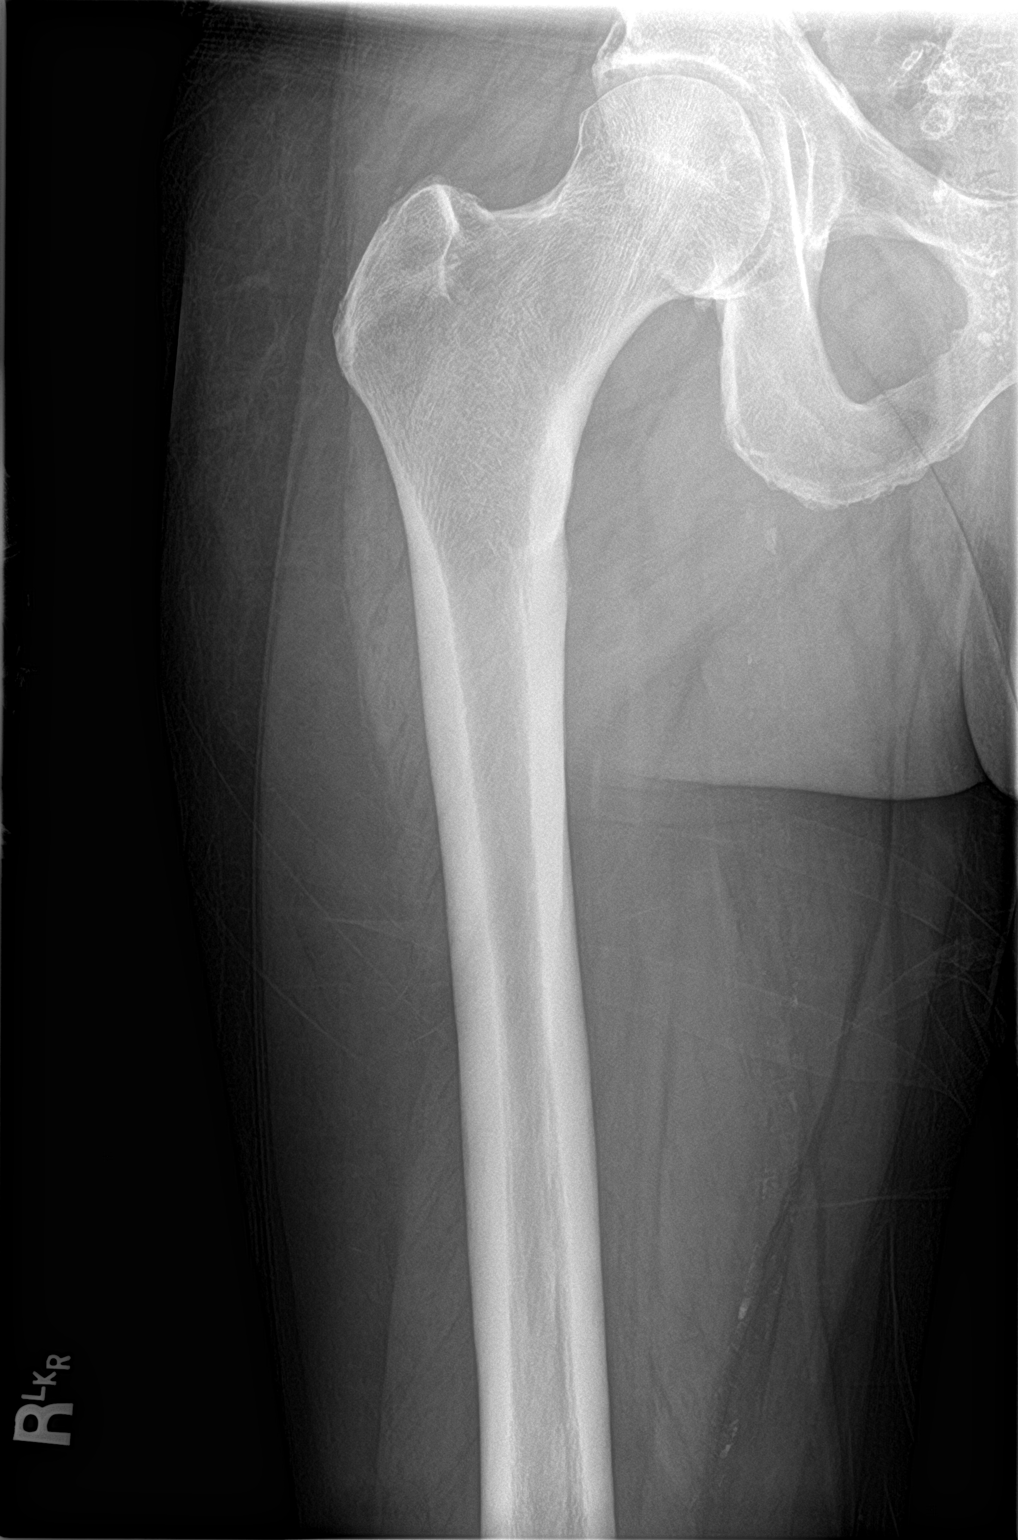
[im 3/3]
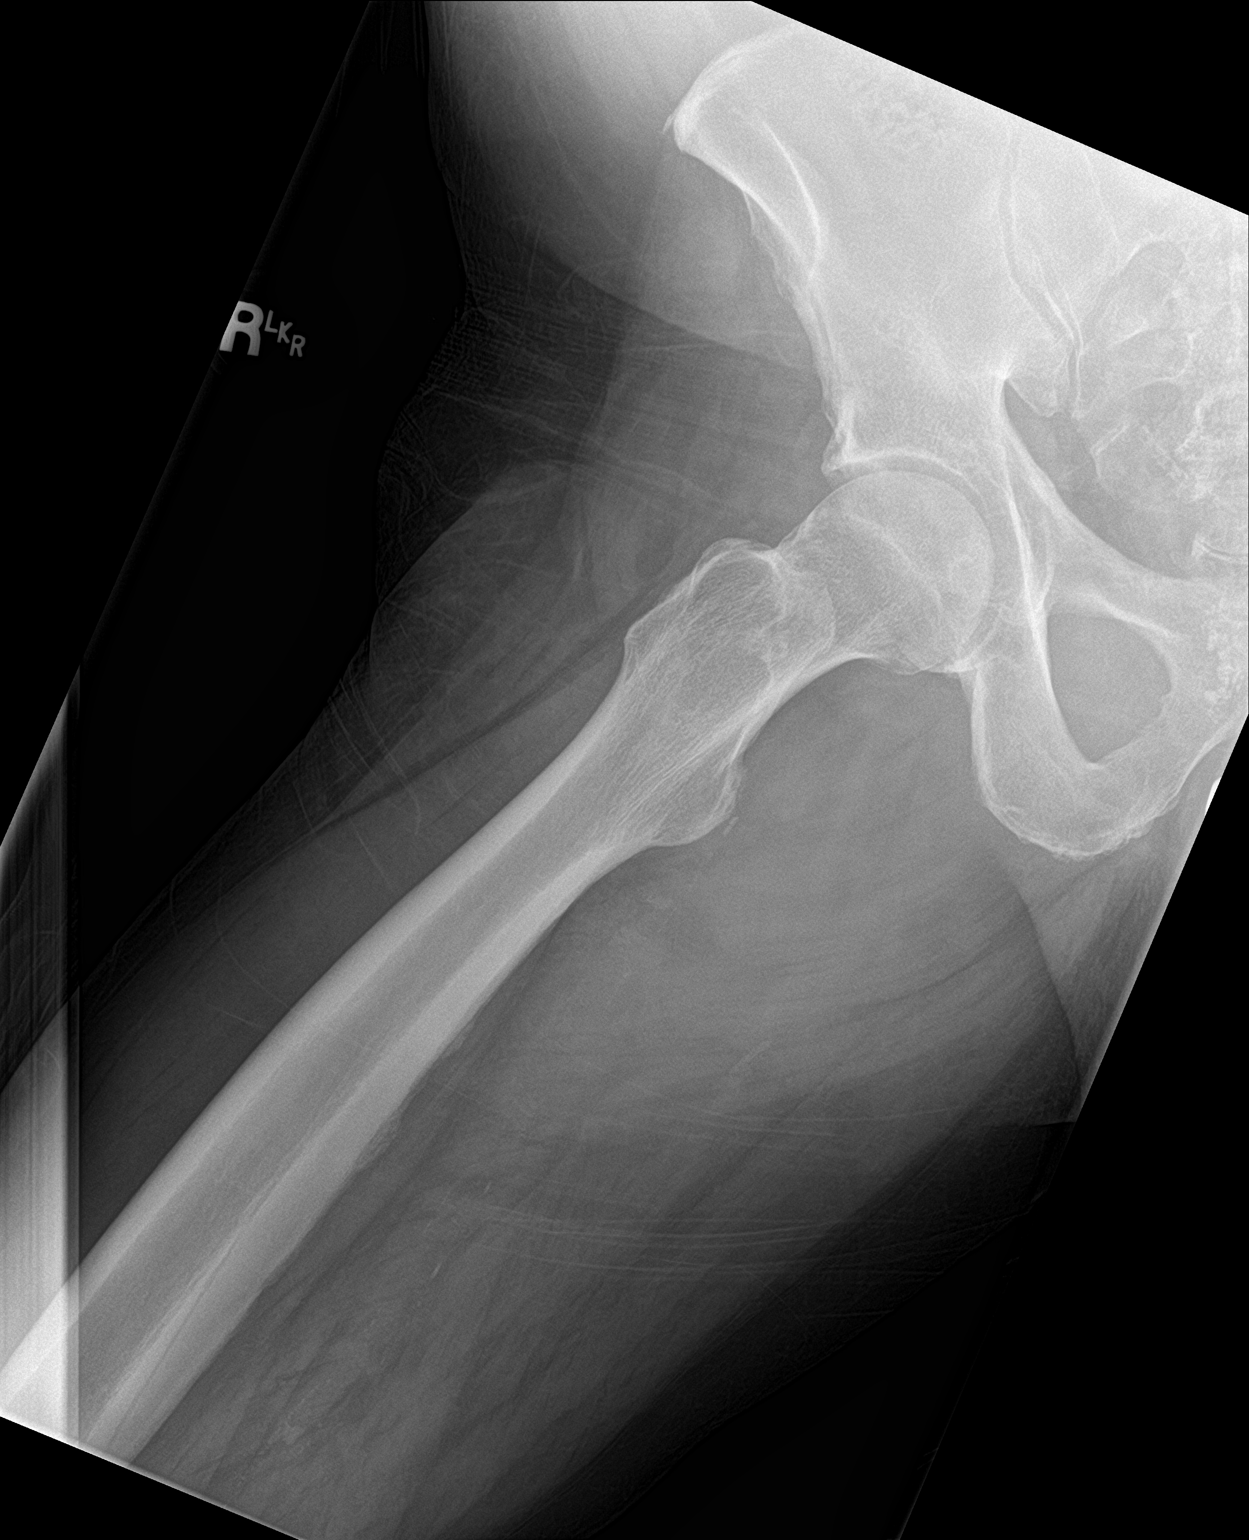

[3 of 3 positions shown; findings below may reference images not displayed]

FINDINGS: Very mild right hip joint degenerative changes. No fracture or
dislocation. No plain film evidence of right femoral head avascular
necrosis.

Penile prosthesis noted. Fusion lower lumbar spine. Prostatic
calcifications and vas deferens calcifications.
IMPRESSION: Very mild right hip joint degenerative changes.

## 2019-04-13 IMAGING — CR DG HIP (WITH OR WITHOUT PELVIS) 2-3V*L*
1 series · 3 of 3 positions shown · non-contrast
Comparison: Sacroiliac joint films 10/03/2018.

CLINICAL DATA: 57-year-old male with chronic back pain. Hip pain
greater on the right. No known injury. Subsequent encounter.

EXAM:
DG HIP (WITH OR WITHOUT PELVIS) 2-3V LEFT

[Series 1: dg hip unilat w or w/o pelvis 2-3 views  · non-contrast · 0.14mm/px · 3 of 3 slices shown]
[im 1/3]
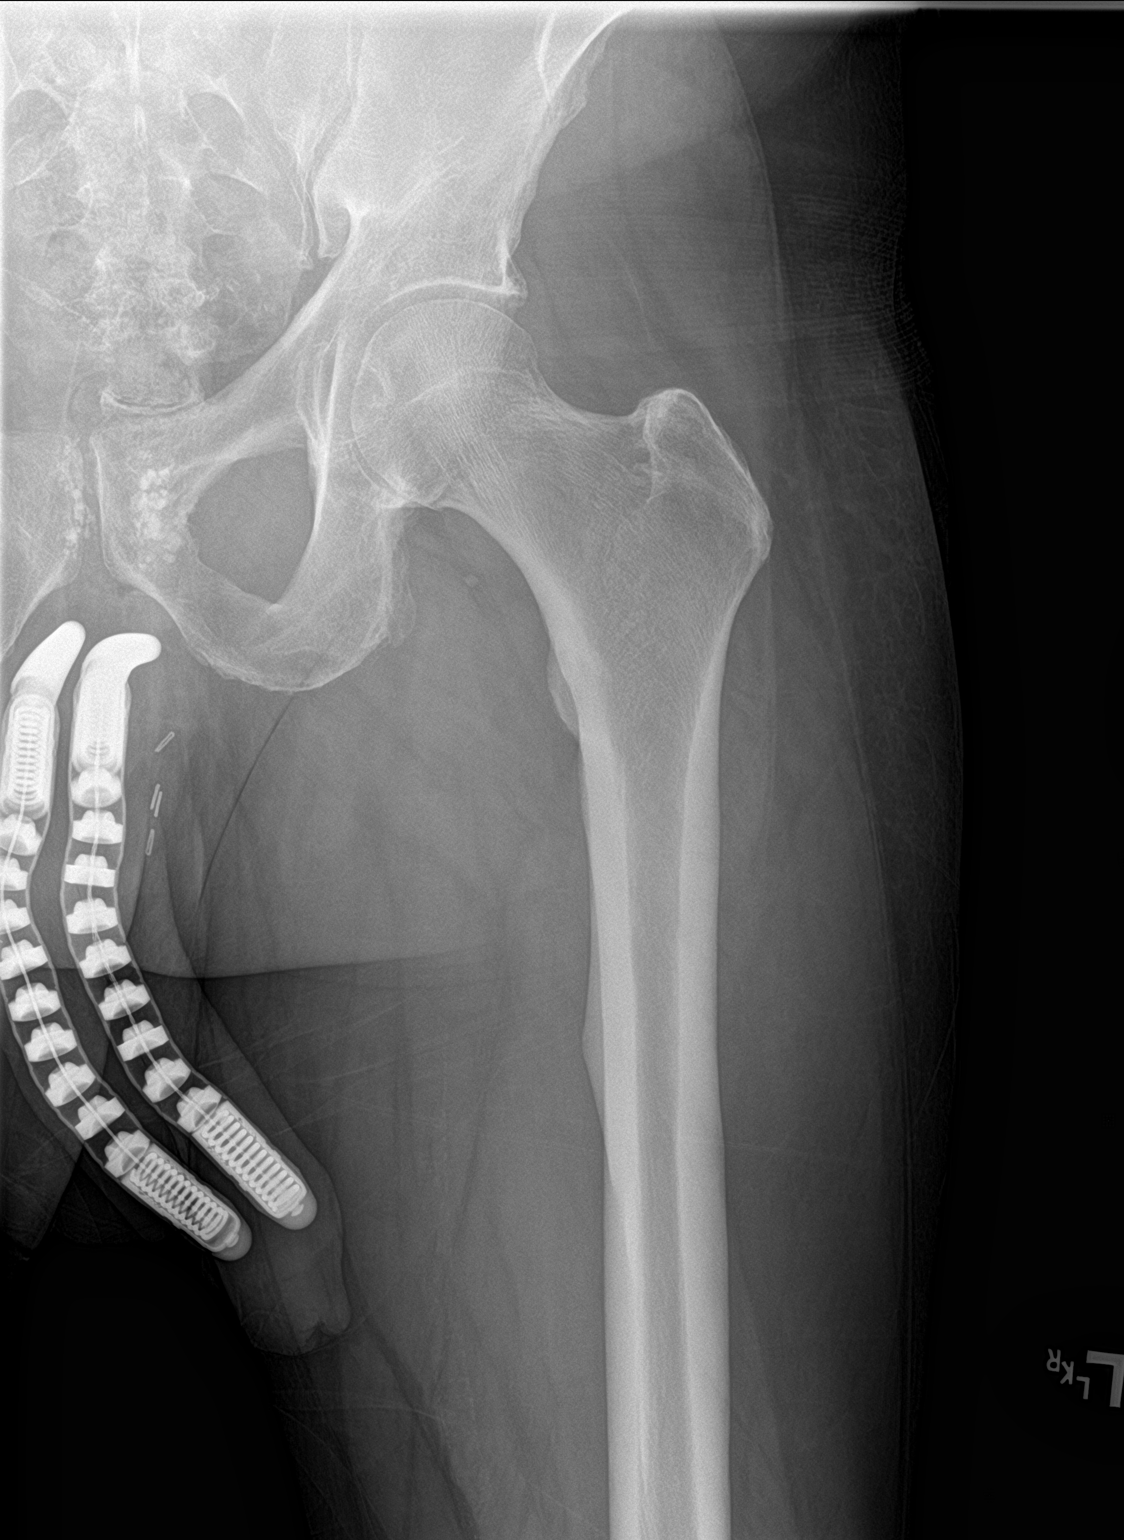
[im 2/3]
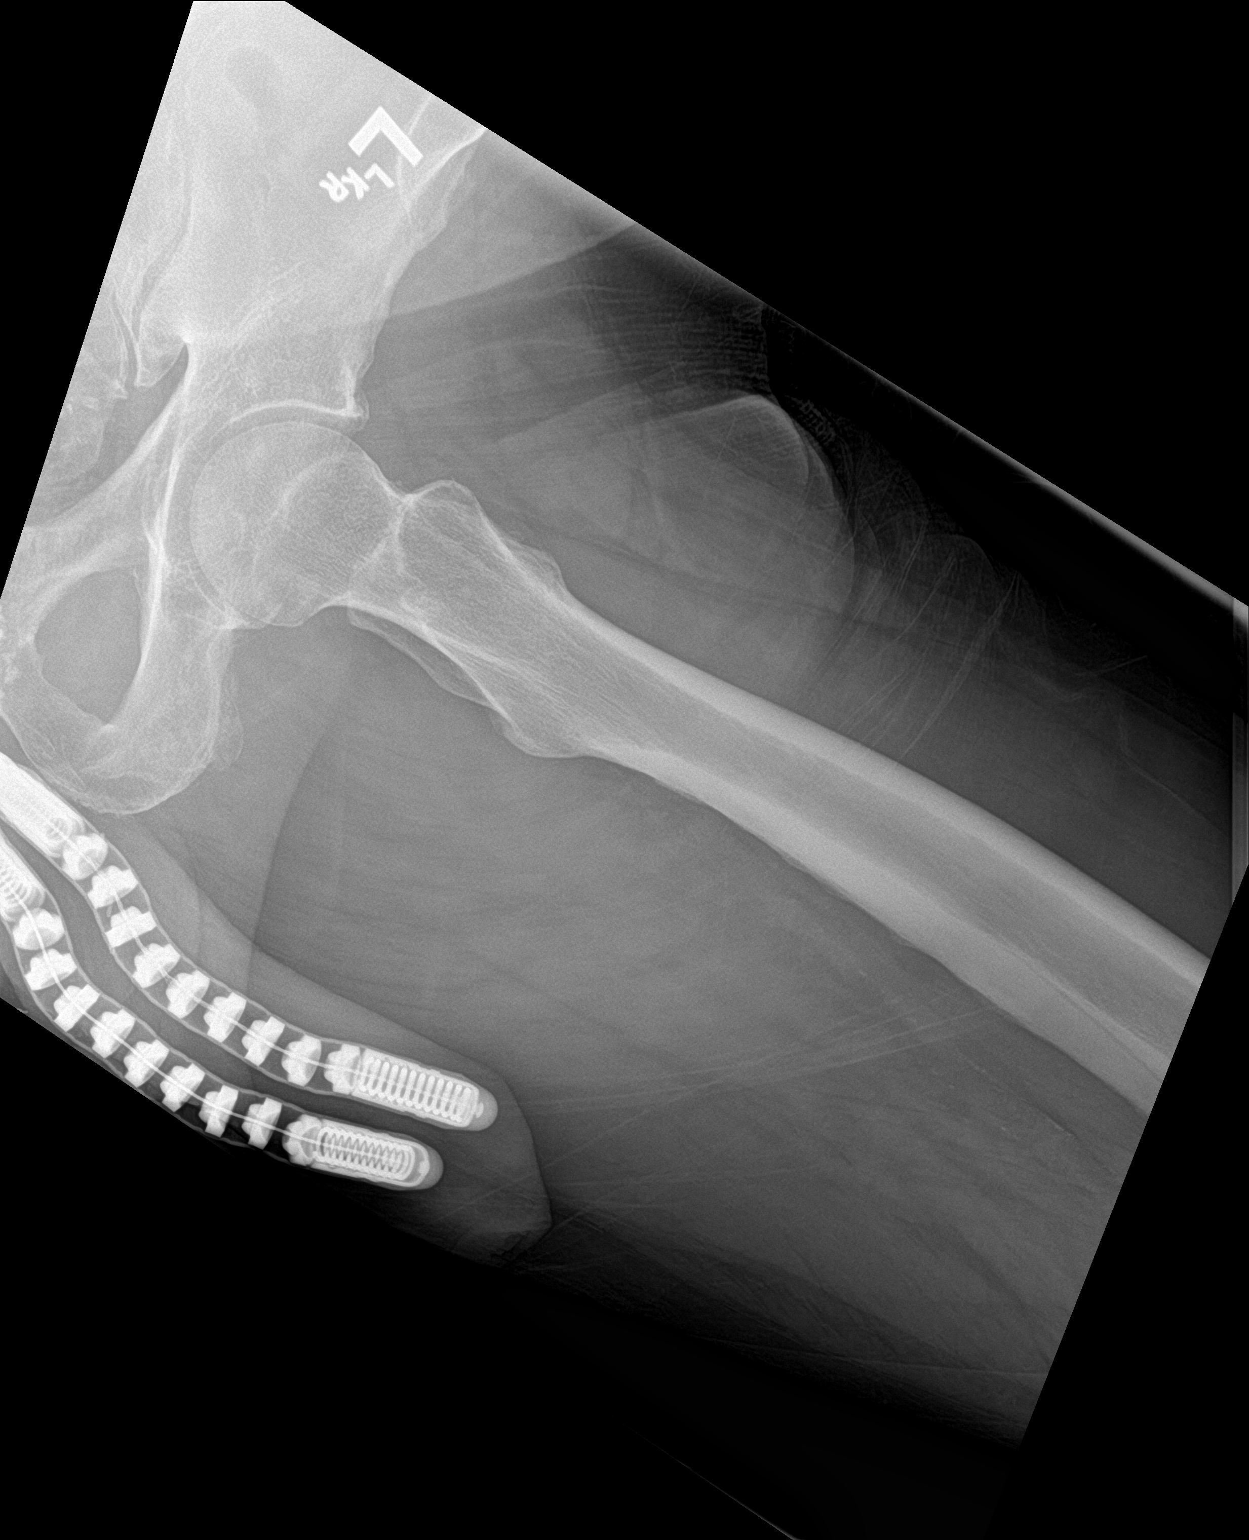
[im 3/3]
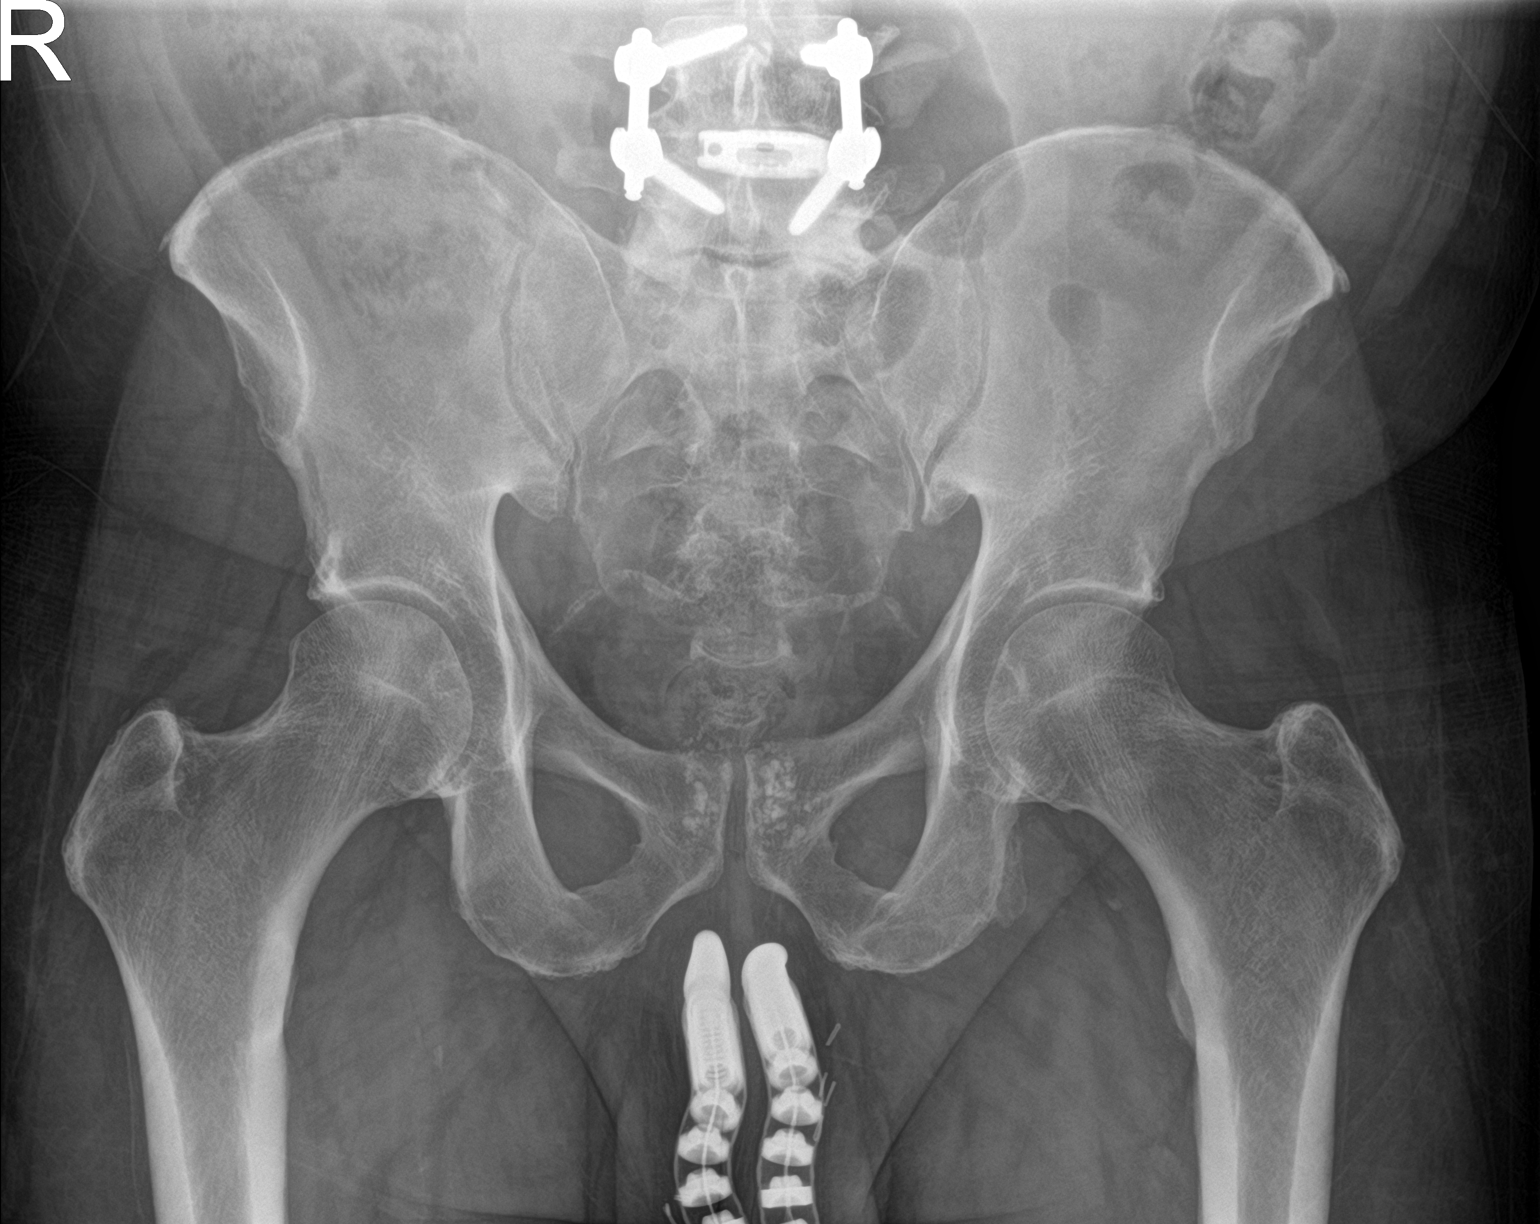

[3 of 3 positions shown; findings below may reference images not displayed]

FINDINGS: Very mild left hip joint degenerative changes. No fracture or
dislocation. No plain film evidence of left femoral head avascular
necrosis.

Penile prosthesis in place. Prior surgery with fusion L4-5.
Prostatic and vas deferens calcifications.
IMPRESSION: Very mild left hip joint degenerative changes.
